# Patient Record
Sex: Female | Born: 1975 | Hispanic: Yes | Marital: Married | Smoking: Never smoker
Health system: Southern US, Community
[De-identification: ages and names within clinical notes are randomized; demographics above are authoritative.]

## PROBLEM LIST (undated history)

## (undated) DIAGNOSIS — R42 Dizziness and giddiness: Secondary | ICD-10-CM

## (undated) DIAGNOSIS — I10 Essential (primary) hypertension: Secondary | ICD-10-CM

## (undated) HISTORY — PX: ABDOMINAL HYSTERECTOMY: SHX81

---

## 2010-12-28 ENCOUNTER — Emergency Department: Payer: Self-pay | Admitting: Emergency Medicine

## 2019-09-23 ENCOUNTER — Emergency Department
Admission: EM | Admit: 2019-09-23 | Discharge: 2019-09-23 | Disposition: A | Discharge status: Home / Self Care | Payer: BC Managed Care – PPO | Attending: Emergency Medicine | Admitting: Emergency Medicine

## 2019-09-23 ENCOUNTER — Other Ambulatory Visit: Payer: Self-pay

## 2019-09-23 ENCOUNTER — Encounter: Payer: Self-pay | Admitting: Emergency Medicine

## 2019-09-23 DIAGNOSIS — Z20822 Contact with and (suspected) exposure to covid-19: Secondary | ICD-10-CM

## 2019-09-23 DIAGNOSIS — M791 Myalgia, unspecified site: Secondary | ICD-10-CM | POA: Insufficient documentation

## 2019-09-23 DIAGNOSIS — R0602 Shortness of breath: Secondary | ICD-10-CM | POA: Diagnosis present

## 2019-09-23 DIAGNOSIS — R509 Fever, unspecified: Secondary | ICD-10-CM | POA: Insufficient documentation

## 2019-09-23 MED ORDER — ONDANSETRON HCL 4 MG PO TABS: 4.0000 mg | ORAL_TABLET | Freq: Three times a day (TID) | ORAL | 0 refills | Status: AC | PRN

## 2019-09-23 MED ORDER — ALBUTEROL SULFATE HFA 108 (90 BASE) MCG/ACT IN AERS: 2.0000 | INHALATION_SPRAY | Freq: Four times a day (QID) | RESPIRATORY_TRACT | 0 refills | Status: AC | PRN

## 2019-09-23 NOTE — ED Notes (Signed)
Dr. Derrill Kay talked with Patient using interpreter, Patient states that she has been sick since Monday, and she had covid test along with her husband due to He has had same kind of symptoms, she complained of nausea, back pain, shortness of breath and body aches, she denies Si/hi or avh, states she has been nervous about her physical condition and about her Job, she wants a note for work to show she has been sick. Nurse will continue to monitor.

## 2019-09-23 NOTE — ED Triage Notes (Signed)
Pt presents to ED via [facility] from home c/o generalized body aches, nausea, and back pain since Monday. Pt was tested for COVID on Monday with husband who has same symptoms. Reports intermittent SOB and intermittent fevers.

## 2019-09-23 NOTE — ED Notes (Signed)
No peripheral IV placed this visit.   Discharge instructions reviewed with patient. Questions fielded by this RN. Patient verbalizes understanding of instructions. Patient discharged home in stable condition per goodman. No acute distress noted at time of discharge.   STRATUS used to discuss POC, follow up and prescriptions.  COVID contact precautions discussed  Pt wheeled to lobby for family ride

## 2019-09-23 NOTE — ED Provider Notes (Signed)
[facility] Emergency Department Provider Note  ____________________________________________   I have reviewed the triage vital signs and the nursing notes.   HISTORY  Chief Complaint Shortness of breath  History limited by: Language [facility] Interpreter utilized   HPI Jenna Raymond is a 44 y.o. female who presents to the emergency department today because of concern for shortness of breath that started today. Patient states that for roughly the past week she has been dealing with fevers, muscle aches and nausea. Her husband has had similar symptoms. She was tested for covid earlier this week but has not yet gotten the result. The patient says that the shortness of breath started today. She denies history of lung or heart disease.   Records reviewed.   No past medical history on file.  There are no problems to display for this patient.   Prior to Admission medications   Medication Sig Start Date End Date Taking? Authorizing Provider  albuterol (VENTOLIN HFA) 108 (90 Base) MCG/ACT inhaler Inhale 2 puffs into the lungs every 6 (six) hours as needed for wheezing or shortness of breath. Suspected COVID 09/23/19   Phineas Semen, MD  ondansetron (ZOFRAN) 4 MG tablet Take 1 tablet (4 mg total) by mouth every 8 (eight) hours as needed. 09/23/19   Phineas Semen, MD    Allergies Patient has no allergy information on record.  No family history on file.  Social History Social History   Tobacco Use  . Smoking status: Not on file  Substance Use Topics  . Alcohol use: Not on file  . Drug use: Not on file    Review of Systems Constitutional: Positive for fever. Eyes: No visual changes. ENT: Positive for change in taste.  Cardiovascular: Denies chest pain. Respiratory: Positive for shortness of breath. Gastrointestinal: No abdominal pain.  Positive for nausea.  Genitourinary: Negative for dysuria. Musculoskeletal: Positive for  back pain and muscle aches.  Skin: Negative for rash. Neurological: Positive for headache.  ____________________________________________   PHYSICAL EXAM:  VITAL SIGNS: ED Triage Vitals  Enc Vitals Group     BP 186/100     Pulse 92     Resp 18     Temp 98.7     Temp src      SpO2 94   Constitutional: Alert and oriented.  Eyes: Conjunctivae are normal.  ENT      Head: Normocephalic and atraumatic.      Nose: No congestion/rhinnorhea.      Mouth/Throat: Mucous membranes are moist.      Neck: No stridor. Hematological/Lymphatic/Immunilogical: No cervical lymphadenopathy. Cardiovascular: Normal rate, regular rhythm.  No murmurs, rubs, or gallops.  Respiratory: Normal respiratory effort without tachypnea nor retractions. Breath sounds are clear and equal bilaterally. No wheezes/rales/rhonchi. Gastrointestinal: Soft and non tender. No rebound. No guarding.  Genitourinary: Deferred Musculoskeletal: Normal range of motion in all extremities. No lower extremity edema. Neurologic:  Normal speech and language. No gross focal neurologic deficits are appreciated.  Skin:  Skin is warm, dry and intact. No rash noted. Psychiatric: Mood and affect are normal. Speech and behavior are normal. Patient exhibits appropriate insight and judgment.  ____________________________________________    LABS (pertinent positives/negatives)  None  ____________________________________________   EKG  None  ____________________________________________    RADIOLOGY  None  ____________________________________________   PROCEDURES  Procedures  ____________________________________________   INITIAL IMPRESSION / ASSESSMENT AND PLAN / ED COURSE  Pertinent labs & imaging results that were available during my care of  the patient were reviewed by me and considered in my medical decision making (see chart for details).   Patient presented to the emergency department today with signs and  symptoms consistent with covid. Has test pending. Came in today because of new symptom of shortness of breath. Discussed covid with patient via interpreter. Discussed obtaining a pulse ox to monitor her oxygen saturation at home. Will give prescription for antiemetic and albuterol.  ____________________________________________   FINAL CLINICAL IMPRESSION(S) / ED DIAGNOSES  Final diagnoses:  Suspected COVID-19 virus infection     Note: This dictation was prepared with Dragon dictation. Any transcriptional errors that result from this process are unintentional     Nance Pear, MD 09/23/19 1935

## 2019-09-26 ENCOUNTER — Encounter: Payer: Self-pay | Admitting: Medical Oncology

## 2019-09-26 ENCOUNTER — Other Ambulatory Visit: Payer: Self-pay

## 2019-09-26 ENCOUNTER — Emergency Department
Admission: EM | Admit: 2019-09-26 | Discharge: 2019-09-26 | Disposition: A | Discharge status: Home / Self Care | Payer: BC Managed Care – PPO | Attending: Emergency Medicine | Admitting: Emergency Medicine

## 2019-09-26 ENCOUNTER — Emergency Department: Payer: BC Managed Care – PPO

## 2019-09-26 DIAGNOSIS — B349 Viral infection, unspecified: Secondary | ICD-10-CM

## 2019-09-26 DIAGNOSIS — U071 COVID-19: Secondary | ICD-10-CM | POA: Insufficient documentation

## 2019-09-26 DIAGNOSIS — M7918 Myalgia, other site: Secondary | ICD-10-CM | POA: Diagnosis present

## 2019-09-26 LAB — CBC WITH DIFFERENTIAL/PLATELET
Abs Immature Granulocytes: 0.09 10*3/uL — ABNORMAL HIGH (ref 0.00–0.07)
Basophils Absolute: 0 10*3/uL (ref 0.0–0.1)
Basophils Relative: 0 %
Eosinophils Absolute: 0 10*3/uL (ref 0.0–0.5)
Eosinophils Relative: 0 %
HCT: 46.1 % — ABNORMAL HIGH (ref 36.0–46.0)
Hemoglobin: 14.7 g/dL (ref 12.0–15.0)
Immature Granulocytes: 1 %
Lymphocytes Relative: 14 %
Lymphs Abs: 1.7 10*3/uL (ref 0.7–4.0)
MCH: 26.1 pg (ref 26.0–34.0)
MCHC: 31.9 g/dL (ref 30.0–36.0)
MCV: 81.7 fL (ref 80.0–100.0)
Monocytes Absolute: 0.7 10*3/uL (ref 0.1–1.0)
Monocytes Relative: 6 %
Neutro Abs: 9.8 10*3/uL — ABNORMAL HIGH (ref 1.7–7.7)
Neutrophils Relative %: 79 %
Platelets: 353 10*3/uL (ref 150–400)
RBC: 5.64 MIL/uL — ABNORMAL HIGH (ref 3.87–5.11)
RDW: 13.6 % (ref 11.5–15.5)
WBC: 12.4 10*3/uL — ABNORMAL HIGH (ref 4.0–10.5)
nRBC: 0 % (ref 0.0–0.2)

## 2019-09-26 LAB — COMPREHENSIVE METABOLIC PANEL
ALT: 32 U/L (ref 0–44)
AST: 31 U/L (ref 15–41)
Albumin: 4.5 g/dL (ref 3.5–5.0)
Alkaline Phosphatase: 95 U/L (ref 38–126)
Anion gap: 14 (ref 5–15)
BUN: 13 mg/dL (ref 6–20)
CO2: 21 mmol/L — ABNORMAL LOW (ref 22–32)
Calcium: 9.6 mg/dL (ref 8.9–10.3)
Chloride: 104 mmol/L (ref 98–111)
Creatinine, Ser: 0.62 mg/dL (ref 0.44–1.00)
GFR calc Af Amer: >60 mL/min (ref >60)
GFR calc non Af Amer: >60 mL/min (ref >60)
Glucose, Bld: 106 mg/dL — ABNORMAL HIGH (ref 70–99)
Potassium: 4.2 mmol/L (ref 3.5–5.1)
Sodium: 139 mmol/L (ref 135–145)
Total Bilirubin: 0.7 mg/dL (ref 0.3–1.2)
Total Protein: 8.8 g/dL — ABNORMAL HIGH (ref 6.5–8.1)

## 2019-09-26 LAB — TROPONIN I (HIGH SENSITIVITY): Troponin I (High Sensitivity): <2 ng/L (ref <18)

## 2019-09-26 LAB — SARS CORONAVIRUS 2 (TAT 6-24 HRS): SARS Coronavirus 2: POSITIVE — AB

## 2019-09-26 NOTE — ED Notes (Signed)
See triage note  Presents with SOB,cough and body aches    Subjective fever  But afebrile on arrival   Awaiting COVID results

## 2019-09-26 NOTE — ED Triage Notes (Signed)
Pt reports that she has been having cough, body aches, fever, sob, fatigue x 1 week. Was tested for covid and did not receive results. Pts husband has same sx's.

## 2019-09-26 NOTE — ED Provider Notes (Signed)
[facility] Emergency Department Provider Note  ____________________________________________   First MD Initiated Contact with Patient 09/26/19 1303     (approximate)  I have reviewed the triage vital signs and the nursing notes.   HISTORY Spanish interpreter  Chief Complaint Generalized Body Aches, Fever, Cough, Fatigue, and Back Pain   HPI Jenna Raymond is a 44 y.o. female presents to the ED with complaint of shortness of breath, cough, body aches and subjective fever.  Patient states that she had a Covid test done through the health department approximately 7 days ago and is still not received the results of that test.  Patient also complains of fatigue.  She states that her husband has same symptoms at home as he has not received the results of his test as well.  Patient is a non-smoker.  She was seen on 09/23/2019 and an albuterol inhaler was given to her.  Patient continues to use the albuterol inhaler every 6 hours.  She denies any other symptoms.      History reviewed. No pertinent past medical history.  There are no problems to display for this patient.   History reviewed. No pertinent surgical history.  Prior to Admission medications   Medication Sig Start Date End Date Taking? Authorizing Provider  albuterol (VENTOLIN HFA) 108 (90 Base) MCG/ACT inhaler Inhale 2 puffs into the lungs every 6 (six) hours as needed for wheezing or shortness of breath. Suspected COVID 09/23/19   Phineas Semen, MD  ondansetron (ZOFRAN) 4 MG tablet Take 1 tablet (4 mg total) by mouth every 8 (eight) hours as needed. 09/23/19   Phineas Semen, MD    Allergies Patient has no known allergies.  No family history on file.  Social History Social History   Tobacco Use  . Smoking status: Never Smoker  . Smokeless tobacco: Never Used  Substance Use Topics  . Alcohol use: Never  . Drug use: Not on file    Review of Systems Constitutional:  Subjective fever/chills Eyes: No visual changes. ENT: No sore throat. Cardiovascular: Positive for anterior chest wall pain. Respiratory: Positive shortness of breath.  Positive for cough. Gastrointestinal: No abdominal pain.  No nausea, no vomiting.  No diarrhea.  No constipation. Genitourinary: Negative for dysuria. Musculoskeletal: Positive for muscle aches. Skin: Negative for rash. Neurological: Negative for headaches, focal weakness or numbness. ____________________________________________   PHYSICAL EXAM:  VITAL SIGNS: ED Triage Vitals [09/26/19 1057]  Enc Vitals Group     BP (!) 155/101     Pulse Rate (!) 110     Resp 18     Temp 98.6 F (37 C)     Temp Source Oral     SpO2 97 %     Weight 158 lb 11.7 oz (72 kg)     Height 5\' 3"  (1.6 m)     Head Circumference      Peak Flow      Pain Score 9     Pain Loc      Pain Edu?      Excl. in GC?     Constitutional: Alert and oriented. Well appearing and in no acute distress. Eyes: Conjunctivae are normal.  Head: Atraumatic. Nose: No congestion/rhinnorhea. Neck: No stridor.   Cardiovascular: Normal rate, regular rhythm. Grossly normal heart sounds.  Good peripheral circulation. Respiratory: Normal respiratory effort.  No retractions. Lungs no wheezes, rales or rhonchi noted.  Patient is able to talk in complete sentences without any difficulty. Gastrointestinal: Soft  and nontender. No distention.  No CVA tenderness. Musculoskeletal: Moves upper and lower extremities any difficulty normal gait was noted. Neurologic:  Normal speech and language. No gross focal neurologic deficits are appreciated. No gait instability. Skin:  Skin is warm, dry and intact. No rash noted. Psychiatric: Mood and affect are normal. Speech and behavior are normal.  ____________________________________________   LABS (all labs ordered are listed, but only abnormal results are displayed)  Labs Reviewed  CBC WITH DIFFERENTIAL/PLATELET -  Abnormal; Notable for the following components:      Result Value   WBC 12.4 (*)    RBC 5.64 (*)    HCT 46.1 (*)    Neutro Abs 9.8 (*)    Abs Immature Granulocytes 0.09 (*)    All other components within normal limits  COMPREHENSIVE METABOLIC PANEL - Abnormal; Notable for the following components:   CO2 21 (*)    Glucose, Bld 106 (*)    Total Protein 8.8 (*)    All other components within normal limits  SARS CORONAVIRUS 2 (TAT 6-24 HRS)  TROPONIN I (HIGH SENSITIVITY)  TROPONIN I (HIGH SENSITIVITY)   ____________________________________________  EKG  Reviewed by MD. ____________________________________________  RADIOLOGY  Official radiology report(s): DG Chest Portable 1 View  Result Date: 09/26/2019 CLINICAL DATA:  Pain short of breath EXAM: PORTABLE CHEST 1 VIEW COMPARISON:  None FINDINGS: Some fullness in left suprahilar region. Cardiomediastinal contours are otherwise unremarkable. Lungs are clear. No signs of pleural effusion. Visualized skeletal structures are unremarkable. IMPRESSION: 1. Some fullness in the left suprahilar region/AP window. Unclear as to whether this is artifact/projectional. Consider follow-up PA and lateral chest x-ray when the patient is able for further assessment. 2. No signs of consolidation or pleural effusion. Electronically Signed   By: Zetta Bills M.D.   On: 09/26/2019 14:19    ____________________________________________   PROCEDURES  Procedure(s) performed (including Critical Care):  Procedures   ____________________________________________   INITIAL IMPRESSION / ASSESSMENT AND PLAN / ED COURSE  As part of my medical decision making, I reviewed the following data within the [facility], Notes from prior ED visits and [place] Controlled Substance Database. Jenna Raymond was evaluated in Emergency Department on 09/26/2019 for the symptoms described in the history of present illness. She was evaluated in the  context of the global COVID-19 pandemic, which necessitated consideration that the patient might be at risk for infection with the SARS-CoV-2 virus that causes COVID-19. Institutional protocols and algorithms that pertain to the evaluation of patients at risk for COVID-19 are in a state of rapid change based on information released by regulatory bodies including the CDC and federal and state organizations. These policies and algorithms were followed during the patient's care in the ED.  44 year old female presents to the ED with complaint of cough, congestion, body aches, subjective fever, shortness of breath and fatigue for 1 week.  Patient states that she and her husband both have the same symptoms.  She went through the health department at one of the clinics to get a Covid test and has not received the results in 7 days.  Chest x-ray was reassuring and lab work showed a white count slightly elevated suggestive of a viral illness.  Patient was made aware that a Covid test was done in the ED today and that she should receive the results of her test tomorrow morning.  Patient was given a note for work stating that she is to quarantine for the next 2  days and if her test is positive she will quarantine for an additional 10 days.  Patient was ambulatory without assistance at the time of her discharge and understands the instructions that the interpreter has given her.  ____________________________________________   FINAL CLINICAL IMPRESSION(S) / ED DIAGNOSES  Final diagnoses:  Viral illness     ED Discharge Orders    None       Note:  This document was prepared using Dragon voice recognition software and may include unintentional dictation errors.    Tommi Rumps, PA-C 09/26/19 1653    Dionne Bucy, MD 10/02/19 [id number]

## 2019-12-17 ENCOUNTER — Ambulatory Visit: Payer: Self-pay

## 2019-12-17 ENCOUNTER — Ambulatory Visit: Payer: BC Managed Care – PPO | Attending: Internal Medicine

## 2019-12-17 DIAGNOSIS — Z23 Encounter for immunization: Secondary | ICD-10-CM

## 2019-12-17 NOTE — Progress Notes (Signed)
   Covid-19 Vaccination Clinic  Name:  Nataleigh Griffin    MRN: [id number] DOB: [birth year]  12/17/2019  Ms. Cuchillas de Trisha Mangle was observed post Covid-19 immunization for 15 minutes without incident. She was provided with Vaccine Information Sheet and instruction to access the V-Safe system.   Ms. Jamas Lav de Trisha Mangle was instructed to call 911 with any severe reactions post vaccine: Marland Kitchen Difficulty breathing  . Swelling of face and throat  . A fast heartbeat  . A bad rash all over body  . Dizziness and weakness   Immunizations Administered    Name Date Dose VIS Date Route   Pfizer COVID-19 Vaccine 12/17/2019 11:49 AM 0.3 mL 09/02/2019 Intramuscular   Manufacturer: [employer], [employer]   Lot: [id number]   NDC: [address]-3

## 2019-12-30 ENCOUNTER — Encounter: Payer: Self-pay | Admitting: *Deleted

## 2019-12-30 ENCOUNTER — Other Ambulatory Visit: Payer: Self-pay

## 2019-12-30 DIAGNOSIS — Y9389 Activity, other specified: Secondary | ICD-10-CM | POA: Diagnosis not present

## 2019-12-30 DIAGNOSIS — S8012XA Contusion of left lower leg, initial encounter: Secondary | ICD-10-CM | POA: Diagnosis not present

## 2019-12-30 DIAGNOSIS — W228XXA Striking against or struck by other objects, initial encounter: Secondary | ICD-10-CM | POA: Diagnosis not present

## 2019-12-30 DIAGNOSIS — Y990 Civilian activity done for income or pay: Secondary | ICD-10-CM | POA: Diagnosis not present

## 2019-12-30 DIAGNOSIS — I10 Essential (primary) hypertension: Secondary | ICD-10-CM | POA: Diagnosis not present

## 2019-12-30 DIAGNOSIS — Y929 Unspecified place or not applicable: Secondary | ICD-10-CM | POA: Diagnosis not present

## 2019-12-30 DIAGNOSIS — S8992XA Unspecified injury of left lower leg, initial encounter: Secondary | ICD-10-CM | POA: Diagnosis present

## 2019-12-30 NOTE — ED Triage Notes (Signed)
Per video interpreter, patient is an employee at [employer] and had a cream container exploded and hit left upper thigh. Patient has pants on, but states left upper thigh is purple and swollen. Patient is able to ambulate. Patient's manager brought the patient to the ED.

## 2019-12-31 ENCOUNTER — Emergency Department: Payer: Worker's Compensation

## 2019-12-31 ENCOUNTER — Emergency Department
Admission: EM | Admit: 2019-12-31 | Discharge: 2019-12-31 | Disposition: A | Discharge status: Home / Self Care | Payer: Worker's Compensation | Attending: Emergency Medicine | Admitting: Emergency Medicine

## 2019-12-31 DIAGNOSIS — T1490XA Injury, unspecified, initial encounter: Secondary | ICD-10-CM

## 2019-12-31 DIAGNOSIS — S8012XA Contusion of left lower leg, initial encounter: Secondary | ICD-10-CM

## 2019-12-31 HISTORY — DX: Essential (primary) hypertension: I10

## 2019-12-31 MED ORDER — IBUPROFEN 400 MG PO TABS
600.0000 mg | ORAL_TABLET | Freq: Once | ORAL | Status: AC
  Administered 2019-12-31: 600 mg via ORAL
  Filled 2019-12-31: qty 2

## 2019-12-31 NOTE — ED Notes (Signed)
Pt reports pressurized aluminum can of cream exploded on left quad   brusing present and pt able to walk and bear wieght on that leg but reports pain   EDP conducting education on the benefits of antiinflammatory meds and ice therapy

## 2019-12-31 NOTE — ED Provider Notes (Signed)
[facility] Emergency Department Provider Note  ____________________________________________  Time seen: Approximately 2:23 AM  I have reviewed the triage vital signs and the nursing notes.   HISTORY  Chief Complaint Leg Pain   HPI Jenna Raymond is a 44 y.o. female who presents for evaluation of left thigh pain.  Patient works at [employer].  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  She reports that one of the aluminum containers exploded and hit her in her thigh.  The contents of the can and cream were not hot.  She is complaining of moderate constant sharp pain that is worse with ambulation.  She did not fall, did not hit her head, did not pass out.  There is no lacerations.  Her manager brought her here for evaluation.  She is able to ambulate.   Past Medical History:  Diagnosis Date  . Hypertension     Past Surgical History:  Procedure Laterality Date  . ABDOMINAL HYSTERECTOMY      Prior to Admission medications   Medication Sig Start Date End Date Taking? Authorizing Provider  albuterol (VENTOLIN HFA) 108 (90 Base) MCG/ACT inhaler Inhale 2 puffs into the lungs every 6 (six) hours as needed for wheezing or shortness of breath. Suspected COVID 09/23/19   Phineas Semen, MD  ondansetron (ZOFRAN) 4 MG tablet Take 1 tablet (4 mg total) by mouth every 8 (eight) hours as needed. 09/23/19   Phineas Semen, MD    Allergies Patient has no known allergies.  No family history on file.  Social History Social History   Tobacco Use  . Smoking status: Never Smoker  . Smokeless tobacco: Never Used  Substance Use Topics  . Alcohol use: Never  . Drug use: Not on file    Review of Systems  Constitutional: Negative for fever. Eyes: Negative for visual changes. ENT: Negative for sore throat. Neck: No neck pain  Cardiovascular: Negative for chest pain. Respiratory: Negative for shortness of  breath. Gastrointestinal: Negative for abdominal pain, vomiting or diarrhea. Genitourinary: Negative for dysuria. Musculoskeletal: Negative for back pain. + L leg pain Skin: Negative for rash. Neurological: Negative for headaches, weakness or numbness. Psych: No SI or HI  ____________________________________________   PHYSICAL EXAM:  VITAL SIGNS: ED Triage Vitals  Enc Vitals Group     BP 12/30/19 2217 (!) 167/95     Pulse Rate 12/30/19 2217 (!) 102     Resp 12/30/19 2217 20     Temp 12/30/19 2217 97.8 F (36.6 C)     Temp Source 12/30/19 2217 Oral     SpO2 12/30/19 2217 96 %     Weight 12/30/19 2232 170 lb (77.1 kg)     Height 12/30/19 2232 5\' 2"  (1.575 m)     Head Circumference --      Peak Flow --      Pain Score 12/30/19 2230 9     Pain Loc --      Pain Edu? --      Excl. in GC? --     Constitutional: Alert and oriented. Well appearing and in no apparent distress. HEENT:      Head: Normocephalic and atraumatic.         Eyes: Conjunctivae are normal. Sclera is non-icteric.       Mouth/Throat: Mucous membranes are moist.       Neck: Supple with no signs of meningismus. Cardiovascular: Regular rate and rhythm.  Respiratory: Normal respiratory  effort. Lungs are clear to auscultation bilaterally.  Gastrointestinal: Soft, non tender Musculoskeletal: bruising to the L thigh with no obvious deformities or lacerations. Strong distal pulses, normal gait Neurologic: Normal speech and language. Face is symmetric. Moving all extremities. No gross focal neurologic deficits are appreciated. Skin: Skin is warm, dry and intact. No rash noted. Psychiatric: Mood and affect are normal. Speech and behavior are normal.  ____________________________________________   LABS (all labs ordered are listed, but only abnormal results are displayed)  Labs Reviewed - No data to display ____________________________________________  EKG  none   ____________________________________________  RADIOLOGY  I have personally reviewed the images performed during this visit and I agree with the Radiologist's read.   Interpretation by Radiologist:  DG FEMUR MIN 2 VIEWS LEFT  Result Date: 12/31/2019 CLINICAL DATA:  Cream container exploded and hit left upper thigh EXAM: LEFT FEMUR 2 VIEWS COMPARISON:  None. FINDINGS: There is no evidence of fracture or other focal bone lesions. Soft tissues are unremarkable. IMPRESSION: Negative. Electronically Signed   By: Donavan Foil M.D.   On: 12/31/2019 02:08     ____________________________________________   PROCEDURES  Procedure(s) performed: None Procedures Critical Care performed:  None ____________________________________________   INITIAL IMPRESSION / ASSESSMENT AND PLAN / ED COURSE  44 y.o. female who presents for evaluation of left thigh pain after sustaining an injury at work.  No fracture, no laceration, neurovascular intact exam.  X-ray negative, reviewed by me.  Patient was given ibuprofen for pain.  Recommended ice.  Discussed my standard return precautions       _____________________________________________ Please note:  Patient was evaluated in Emergency Department today for the symptoms described in the history of present illness. Patient was evaluated in the context of the global COVID-19 pandemic, which necessitated consideration that the patient might be at risk for infection with the SARS-CoV-2 virus that causes COVID-19. Institutional protocols and algorithms that pertain to the evaluation of patients at risk for COVID-19 are in a state of rapid change based on information released by regulatory bodies including the CDC and federal and state organizations. These policies and algorithms were followed during the patient's care in the ED.  Some ED evaluations and interventions may be delayed as a result of limited staffing during the pandemic.   [place] Controlled Substance  Database was reviewed by me. ____________________________________________   FINAL CLINICAL IMPRESSION(S) / ED DIAGNOSES   Final diagnoses:  Trauma  Contusion of left lower extremity, initial encounter      NEW MEDICATIONS STARTED DURING THIS VISIT:  ED Discharge Orders    None       Note:  This document was prepared using Dragon voice recognition software and may include unintentional dictation errors.    Alfred Levins, [place], MD 12/31/19 Reece Agar

## 2019-12-31 NOTE — ED Notes (Addendum)
No peripheral IV placed this visit.   Discharge instructions reviewed with patient. Questions fielded by this RN. Patient verbalizes understanding of instructions. Patient discharged home in stable condition per veronese. No acute distress noted at time of discharge.   Delay to DC d/t worker's comp paperwork with registration

## 2020-01-07 ENCOUNTER — Ambulatory Visit: Payer: BC Managed Care – PPO | Attending: Internal Medicine

## 2020-01-07 ENCOUNTER — Ambulatory Visit: Payer: Self-pay

## 2020-01-07 DIAGNOSIS — Z23 Encounter for immunization: Secondary | ICD-10-CM

## 2020-01-07 NOTE — Progress Notes (Signed)
   Covid-19 Vaccination Clinic  Name:  Francheska Villeda    MRN: [id number] DOB: 02-Jun-1976  01/07/2020  Ms. Cuchillas de Trisha Mangle was observed post Covid-19 immunization for 15 minutes without incident. She was provided with Vaccine Information Sheet and instruction to access the V-Safe system.   Ms. Jamas Lav de Trisha Mangle was instructed to call 911 with any severe reactions post vaccine: Marland Kitchen Difficulty breathing  . Swelling of face and throat  . A fast heartbeat  . A bad rash all over body  . Dizziness and weakness   Immunizations Administered    Name Date Dose VIS Date Route   Pfizer COVID-19 Vaccine 01/07/2020 11:29 AM 0.3 mL 09/02/2019 Intramuscular   Manufacturer: [employer], [employer]   Lot: [id number]   NDC: [address]-3

## 2020-09-27 ENCOUNTER — Encounter: Payer: Self-pay | Admitting: Emergency Medicine

## 2020-09-27 ENCOUNTER — Other Ambulatory Visit: Payer: Self-pay

## 2020-09-27 ENCOUNTER — Emergency Department: Payer: Worker's Compensation

## 2020-09-27 DIAGNOSIS — X501XXA Overexertion from prolonged static or awkward postures, initial encounter: Secondary | ICD-10-CM | POA: Diagnosis not present

## 2020-09-27 DIAGNOSIS — I10 Essential (primary) hypertension: Secondary | ICD-10-CM | POA: Diagnosis not present

## 2020-09-27 DIAGNOSIS — Y990 Civilian activity done for income or pay: Secondary | ICD-10-CM | POA: Insufficient documentation

## 2020-09-27 DIAGNOSIS — S86911A Strain of unspecified muscle(s) and tendon(s) at lower leg level, right leg, initial encounter: Secondary | ICD-10-CM | POA: Insufficient documentation

## 2020-09-27 DIAGNOSIS — M25561 Pain in right knee: Secondary | ICD-10-CM | POA: Insufficient documentation

## 2020-09-27 DIAGNOSIS — S99911A Unspecified injury of right ankle, initial encounter: Secondary | ICD-10-CM | POA: Diagnosis present

## 2020-09-27 NOTE — ED Triage Notes (Signed)
Pt to ED from work c/o right ankle injury today and then pain to right calf, denies falling.  Pt filing WC, works at [employer], work profile requires UDS.  Pt A&Ox4, chest rise even and unlabored, in NAD at this time.

## 2020-09-28 ENCOUNTER — Emergency Department: Payer: Worker's Compensation

## 2020-09-28 ENCOUNTER — Emergency Department
Admission: EM | Admit: 2020-09-28 | Discharge: 2020-09-28 | Disposition: A | Discharge status: Home / Self Care | Payer: Worker's Compensation | Attending: Emergency Medicine | Admitting: Emergency Medicine

## 2020-09-28 DIAGNOSIS — S86811A Strain of other muscle(s) and tendon(s) at lower leg level, right leg, initial encounter: Secondary | ICD-10-CM

## 2020-09-28 NOTE — ED Provider Notes (Signed)
[facility] Emergency Department Provider Note   ____________________________________________   None    (approximate)  I have reviewed the triage vital signs and the nursing notes.   HISTORY  Chief Complaint Ankle Injury    HPI Jenna Raymond is a 45 y.o. female with past medical history of hypertension who presents to the ED complaining of leg pain.  Patient reports that she was going up stairs at work when she twisted her right leg and had immediate onset of pain just below her right knee.  Pain is described as sharp and exacerbated by movement.  She has been able to range her knee since then but has significant discomfort doing so.  She denies any pain in her foot or ankle.  She has no history of prior injuries to her right knee.  She has not taken anything for symptoms prior to arrival.        Past Medical History:  Diagnosis Date  . Hypertension     There are no problems to display for this patient.   Past Surgical History:  Procedure Laterality Date  . CESAREAN SECTION      Prior to Admission medications   Medication Sig Start Date End Date Taking? Authorizing Provider  albuterol (VENTOLIN HFA) 108 (90 Base) MCG/ACT inhaler Inhale 2 puffs into the lungs every 6 (six) hours as needed for wheezing or shortness of breath. Suspected COVID 09/23/19   Phineas Semen, MD  ondansetron (ZOFRAN) 4 MG tablet Take 1 tablet (4 mg total) by mouth every 8 (eight) hours as needed. 09/23/19   Phineas Semen, MD    Allergies Patient has no known allergies.  History reviewed. No pertinent family history.  Social History Social History   Tobacco Use  . Smoking status: Never Smoker  . Smokeless tobacco: Never Used  Substance Use Topics  . Alcohol use: Never  . Drug use: Never    Review of Systems  Constitutional: No fever/chills Eyes: No visual changes. ENT: No sore throat. Cardiovascular: Denies chest pain. Respiratory:  Denies shortness of breath. Gastrointestinal: No abdominal pain.  No nausea, no vomiting.  No diarrhea.  No constipation. Genitourinary: Negative for dysuria. Musculoskeletal: Negative for back pain.  Positive for right knee pain. Skin: Negative for rash. Neurological: Negative for headaches, focal weakness or numbness.  ____________________________________________   PHYSICAL EXAM:  VITAL SIGNS: ED Triage Vitals  Enc Vitals Group     BP 09/27/20 2140 (!) 149/75     Pulse Rate 09/27/20 2140 (!) 105     Resp 09/27/20 2140 16     Temp 09/27/20 2140 97.6 F (36.4 C)     Temp Source 09/27/20 2140 Oral     SpO2 09/27/20 2140 100 %     Weight 09/27/20 2141 160 lb (72.6 kg)     Height 09/27/20 2141 5\' 2"  (1.575 m)     Head Circumference --      Peak Flow --      Pain Score 09/27/20 2141 10     Pain Loc --      Pain Edu? --      Excl. in GC? --     Constitutional: Alert and oriented. Eyes: Conjunctivae are normal. Head: Atraumatic. Nose: No congestion/rhinnorhea. Mouth/Throat: Mucous membranes are moist. Neck: Normal ROM Cardiovascular: Normal rate, regular rhythm. Grossly normal heart sounds.  2+ DP pulses bilaterally. Respiratory: Normal respiratory effort.  No retractions. Lungs CTAB. Gastrointestinal: Soft and nontender. No distention. Genitourinary: deferred Musculoskeletal:  No lower extremity tenderness nor edema.  Tenderness to right posterior knee and upper calf with no obvious deformity.  No left knee tenderness or bilateral ankle tenderness. Neurologic:  Normal speech and language. No gross focal neurologic deficits are appreciated. Skin:  Skin is warm, dry and intact. No rash noted. Psychiatric: Mood and affect are normal. Speech and behavior are normal.  ____________________________________________   LABS (all labs ordered are listed, but only abnormal results are displayed)  Labs Reviewed - No data to display   PROCEDURES  Procedure(s) performed (including  Critical Care):  Procedures   ____________________________________________   INITIAL IMPRESSION / ASSESSMENT AND PLAN / ED COURSE       45 year old female with past medical history of hypertension who presents to the ED complaining of pain behind her right knee and in the area of her upper calf after twisting her leg at work.  She is neurovascularly intact to her bilateral lower extremities.  X-ray of right ankle reviewed by me and shows no obvious fracture or dislocation.  We will further assess with x-ray of right knee but I suspect calf muscle strain as the source of her pain.  Right knee x-ray reviewed by me and shows no fracture or dislocation.  Patient is appropriate for discharge home with conservative treatment with NSAIDs, rest, and ice.  Patient counseled to follow-up with her PCP and otherwise return to the ED for new worsening symptoms, patient agrees with plan.      ____________________________________________   FINAL CLINICAL IMPRESSION(S) / ED DIAGNOSES  Final diagnoses:  Strain of calf muscle, right, initial encounter     ED Discharge Orders    None       Note:  This document was prepared using Dragon voice recognition software and may include unintentional dictation errors.   Chesley Noon, MD 09/28/20 [phone]

## 2021-02-27 ENCOUNTER — Other Ambulatory Visit: Payer: Self-pay | Admitting: Obstetrics and Gynecology

## 2021-02-27 DIAGNOSIS — Z1231 Encounter for screening mammogram for malignant neoplasm of breast: Secondary | ICD-10-CM

## 2021-04-16 ENCOUNTER — Ambulatory Visit
Admission: RE | Admit: 2021-04-16 | Discharge: 2021-04-16 | Disposition: A | Discharge status: Home / Self Care | Payer: BC Managed Care – PPO | Source: Ambulatory Visit | Attending: Obstetrics and Gynecology | Admitting: Obstetrics and Gynecology

## 2021-04-16 ENCOUNTER — Other Ambulatory Visit: Payer: Self-pay

## 2021-04-16 DIAGNOSIS — Z1231 Encounter for screening mammogram for malignant neoplasm of breast: Secondary | ICD-10-CM | POA: Diagnosis present

## 2021-04-29 ENCOUNTER — Other Ambulatory Visit: Payer: Self-pay | Admitting: Obstetrics and Gynecology

## 2021-04-29 DIAGNOSIS — R928 Other abnormal and inconclusive findings on diagnostic imaging of breast: Secondary | ICD-10-CM

## 2021-04-29 DIAGNOSIS — R921 Mammographic calcification found on diagnostic imaging of breast: Secondary | ICD-10-CM

## 2021-05-01 ENCOUNTER — Ambulatory Visit (INDEPENDENT_AMBULATORY_CARE_PROVIDER_SITE_OTHER): Payer: BC Managed Care – PPO | Admitting: Surgery

## 2021-05-01 ENCOUNTER — Encounter: Payer: Self-pay | Admitting: Surgery

## 2021-05-01 ENCOUNTER — Other Ambulatory Visit: Payer: Self-pay

## 2021-05-01 DIAGNOSIS — G8929 Other chronic pain: Secondary | ICD-10-CM

## 2021-05-01 DIAGNOSIS — M25561 Pain in right knee: Secondary | ICD-10-CM

## 2021-05-01 DIAGNOSIS — Z87898 Personal history of other specified conditions: Secondary | ICD-10-CM | POA: Diagnosis not present

## 2021-05-01 NOTE — Progress Notes (Signed)
Office Visit Note   Patient: Jenna Raymond           Date of Birth: 03-05-1976           MRN: [id number] Visit Date: 05/01/2021              Requested by: [address] [place],  [place] [address] PCP: [facility]   Assessment & Plan: Visit Diagnoses:  1. Mechanical pain of right knee   2. Chronic pain of right knee   3. History of elevated antinuclear antibody (ANA)     Plan: With patient's documented history of elevated ANA without any further work-up or referral I recommended getting a CBC and arthritis panel today and this was drawn.  In regards to her ongoing right knee pain and some feeling mechanical symptoms I offered conservative treatment with injection.  Patient consent right knee was prepped with Betadine and intra-articular Marcaine/Depo-Medrol 6 to 1 injection performed.  Tolerated without complication.  Follow-up with me in 3 weeks for recheck of her right knee and to review labs.  If patient's knee continues to be symptomatic with some feeling mechanical symptoms I will plan to get an MRI to rule out meniscal tear.  All questions answered.  Follow-Up Instructions: Return in about 3 weeks (around 05/22/2021) for with Ranald Alessio recheck right knee and review labs.   Orders:  Orders Placed This Encounter  Procedures   Large Joint Inj   CBC   Antinuclear Antib (ANA)   Uric acid   Rheumatoid Factor   Sed Rate (ESR)    No orders of the defined types were placed in this encounter.     Procedures: Large Joint Inj: R knee on 05/01/2021 12:09 PM Indications: pain Details: 25 G 1.5 in needle, anteromedial approach Medications: 6 mL bupivacaine 0.25 %; 40 mg methylPREDNISolone acetate 40 MG/ML; 3 mL lidocaine 1 % Outcome: tolerated well, no immediate complications Consent was given by the patient. Patient was prepped and draped in the usual sterile fashion.      Clinical Data: No additional  findings.   Subjective: Chief Complaint  Patient presents with   Right Knee - Pain    HPI Patient comes in with the help of interpreter for evaluation of right knee pain.  Patient states that she had off-and-on knee problems for about 6 Months.  She was seen at the [facility] ED January 6022 after suffered a twisting injury of the right knee.  Patient had x-rays showed:  EXAM: RIGHT KNEE - 1-2 VIEW   COMPARISON:  None.   FINDINGS: Two view radiograph right knee demonstrates normal alignment. No fracture or dislocation. Medial and lateral compartment joint spaces are preserved. No effusion. Mild degenerative enthesopathy involving the insertion of the quadriceps tendon upon the patella. Soft tissues are unremarkable.   IMPRESSION: No acute fracture or dislocation.     Electronically Signed   By: Fidela Salisbury MD   On: 09/28/2020 01:10  Pain somewhat eased off but then she began having more problems with this was evaluated by her primary care provider last month.  She began to complain of pain and popping more along the medial joint line.  No complaints of true instability.  Pain worse with ambulation, pivoting movements.  I reviewed patient's notes from her clinical clinic primary care provider and she has a documented history of being an ANA positive.  Patient states that she was told this some years ago.  She  has never been referred to a rheumatologist and not much has been mentioned of it since she was previously advised.    Review of Systems No current cardiopulmonary GI GU issues  Objective: Vital Signs: There were no vitals taken for this visit.  Physical Exam Constitutional:      Appearance: Normal appearance.  HENT:     Head: Normocephalic and atraumatic.  Eyes:     Extraocular Movements: Extraocular movements intact.  Pulmonary:     Effort: No respiratory distress.  Musculoskeletal:     Comments: Gait is minimally antalgic.  Negative logroll bilateral hips.   Negative straight leg raise.  Right knee good range of motion.  No swelling or palpable effusion.  Some medial lateral joint line tenderness.  Mild discomfort with McMurray's testing.  Cruciate and collateral ligaments are stable.  Negative patellar apprehension.  Calf nontender.  Neurovascular intact.  Neurological:     General: No focal deficit present.     Mental Status: She is alert and oriented to person, place, and time.  Psychiatric:        Mood and Affect: Mood normal.    Ortho Exam  Specialty Comments:  No specialty comments available.  Imaging: No results found.   PMFS History: There are no problems to display for this patient.  Past Medical History:  Diagnosis Date   Hypertension     Family History  Problem Relation Age of Onset   Breast cancer Paternal Aunt     Past Surgical History:  Procedure Laterality Date   CESAREAN SECTION     Social History   Occupational History   Not on file  Tobacco Use   Smoking status: Never   Smokeless tobacco: Never  Substance and Sexual Activity   Alcohol use: Never   Drug use: Never   Sexual activity: Not on file

## 2021-05-03 LAB — CBC
HCT: 41.6 % (ref 35.0–45.0)
Hemoglobin: 13.8 g/dL (ref 11.7–15.5)
MCH: 27.8 pg (ref 27.0–33.0)
MCHC: 33.2 g/dL (ref 32.0–36.0)
MCV: 83.9 fL (ref 80.0–100.0)
MPV: 11.4 fL (ref 7.5–12.5)
Platelets: 349 10*3/uL (ref 140–400)
RBC: 4.96 10*6/uL (ref 3.80–5.10)
RDW: 13 % (ref 11.0–15.0)
WBC: 9.4 10*3/uL (ref 3.8–10.8)

## 2021-05-03 LAB — SEDIMENTATION RATE: Sed Rate: 43 mm/h — ABNORMAL HIGH (ref 0–20)

## 2021-05-03 LAB — RHEUMATOID FACTOR: Rheumatoid fact SerPl-aCnc: <14 IU/mL (ref <14)

## 2021-05-03 LAB — URIC ACID: Uric Acid, Serum: 6.8 mg/dL (ref 2.5–7.0)

## 2021-05-03 LAB — ANA: Anti Nuclear Antibody (ANA): NEGATIVE

## 2021-05-06 ENCOUNTER — Other Ambulatory Visit: Payer: Self-pay

## 2021-05-06 ENCOUNTER — Ambulatory Visit
Admission: RE | Admit: 2021-05-06 | Discharge: 2021-05-06 | Disposition: A | Discharge status: Home / Self Care | Payer: BC Managed Care – PPO | Source: Ambulatory Visit | Attending: Obstetrics and Gynecology | Admitting: Obstetrics and Gynecology

## 2021-05-06 ENCOUNTER — Other Ambulatory Visit: Payer: Self-pay | Admitting: Obstetrics and Gynecology

## 2021-05-06 DIAGNOSIS — R928 Other abnormal and inconclusive findings on diagnostic imaging of breast: Secondary | ICD-10-CM

## 2021-05-06 DIAGNOSIS — R921 Mammographic calcification found on diagnostic imaging of breast: Secondary | ICD-10-CM

## 2021-05-07 MED ORDER — BUPIVACAINE HCL 0.25 % IJ SOLN
6.0000 mL | INTRAMUSCULAR | Status: AC | PRN
Start: 2021-05-01 — End: 2021-05-01
  Administered 2021-05-01: 6 mL via INTRA_ARTICULAR

## 2021-05-07 MED ORDER — METHYLPREDNISOLONE ACETATE 40 MG/ML IJ SUSP
40.0000 mg | INTRAMUSCULAR | Status: AC | PRN
  Administered 2021-05-01: 40 mg via INTRA_ARTICULAR

## 2021-05-07 MED ORDER — LIDOCAINE HCL 1 % IJ SOLN
3.0000 mL | INTRAMUSCULAR | Status: AC | PRN
  Administered 2021-05-01: 3 mL

## 2021-05-10 ENCOUNTER — Encounter
Admission: RE | Disposition: A | Discharge status: Home / Self Care | Payer: Self-pay | Source: Home / Self Care | Attending: Gastroenterology

## 2021-05-10 ENCOUNTER — Ambulatory Visit
Admission: RE | Admit: 2021-05-10 | Discharge: 2021-05-10 | Disposition: A | Discharge status: Home / Self Care | Payer: BC Managed Care – PPO | Attending: Gastroenterology | Admitting: Gastroenterology

## 2021-05-10 ENCOUNTER — Encounter: Payer: Self-pay | Admitting: *Deleted

## 2021-05-10 ENCOUNTER — Ambulatory Visit: Payer: BC Managed Care – PPO | Admitting: Anesthesiology

## 2021-05-10 DIAGNOSIS — K5730 Diverticulosis of large intestine without perforation or abscess without bleeding: Secondary | ICD-10-CM | POA: Diagnosis not present

## 2021-05-10 DIAGNOSIS — Z1211 Encounter for screening for malignant neoplasm of colon: Secondary | ICD-10-CM | POA: Insufficient documentation

## 2021-05-10 DIAGNOSIS — K640 First degree hemorrhoids: Secondary | ICD-10-CM | POA: Insufficient documentation

## 2021-05-10 HISTORY — PX: COLONOSCOPY WITH PROPOFOL: SHX5780

## 2021-05-10 LAB — POCT PREGNANCY, URINE: Preg Test, Ur: NEGATIVE

## 2021-05-10 SURGERY — COLONOSCOPY WITH PROPOFOL
Anesthesia: General

## 2021-05-10 MED ORDER — PROPOFOL 500 MG/50ML IV EMUL
INTRAVENOUS | Status: DC | PRN
  Administered 2021-05-10: 120 ug/kg/min via INTRAVENOUS

## 2021-05-10 MED ORDER — PROPOFOL 10 MG/ML IV BOLUS
INTRAVENOUS | Status: DC | PRN
  Administered 2021-05-10: 100 mg via INTRAVENOUS

## 2021-05-10 MED ORDER — SODIUM CHLORIDE 0.9 % IV SOLN
INTRAVENOUS | Status: DC
  Administered 2021-05-10: 1000 mL via INTRAVENOUS

## 2021-05-10 MED ORDER — PROPOFOL 500 MG/50ML IV EMUL
INTRAVENOUS | Status: AC
  Filled 2021-05-10: qty 50

## 2021-05-10 MED ORDER — DEXMEDETOMIDINE HCL IN NACL 200 MCG/50ML IV SOLN
INTRAVENOUS | Status: DC | PRN
  Administered 2021-05-10: 8 ug via INTRAVENOUS

## 2021-05-10 MED ORDER — LIDOCAINE HCL (PF) 2 % IJ SOLN
INTRAMUSCULAR | Status: AC
  Filled 2021-05-10: qty 5

## 2021-05-10 NOTE — Transfer of Care (Signed)
Immediate Anesthesia Transfer of Care Note  Patient: Jenna Raymond  Procedure(s) Performed: COLONOSCOPY WITH PROPOFOL  Patient Location: PACU and Endoscopy Unit  Anesthesia Type:General  Level of Consciousness: drowsy and patient cooperative  Airway & Oxygen Therapy: Patient Spontanous Breathing  Post-op Assessment: Report given to RN and Post -op Vital signs reviewed and stable  Post vital signs: Reviewed and stable  Last Vitals:  Vitals Value Taken Time  BP 93/55 05/10/21 1050  Temp 36.4 C 05/10/21 1050  Pulse 85 05/10/21 1050  Resp 16 05/10/21 1051  SpO2 95 % 05/10/21 1050  Vitals shown include unvalidated device data.  Last Pain:  Vitals:   05/10/21 1050  TempSrc: Temporal  PainSc: Asleep         Complications: No notable events documented.

## 2021-05-10 NOTE — H&P (Signed)
[facility] Gastroenterology Pre-Procedure H&P   Patient ID: Jenna Raymond is a 45 y.o. female.  Gastroenterology Provider: Jaynie Collins, DO  Referring Provider:  Dr. Burnadette Pop PCP: [facility]  Date: 05/10/2021  HPI Jenna Raymond is a 45 y.o. female who presents today for Colonoscopy for initial screening colonoscopy. History is garnered via chart review and interpreter at bedside.  She denies any current Gi symptoms.   Past Medical History:  Diagnosis Date   Hypertension     Past Surgical History:  Procedure Laterality Date   CESAREAN SECTION      Family History No h/o GI disease or malignancy  Review of Systems  Constitutional:  Negative for activity change, appetite change, fatigue and fever.  HENT:  Negative for trouble swallowing and voice change.   Respiratory:  Negative for shortness of breath and wheezing.   Cardiovascular:  Negative for chest pain and palpitations.  Gastrointestinal:  Negative for abdominal distention, abdominal pain, anal bleeding, blood in stool, constipation, diarrhea, nausea, rectal pain and vomiting.  Musculoskeletal:  Negative for arthralgias and myalgias.  Skin:  Negative for color change and pallor.  Neurological:  Negative for dizziness, syncope and weakness.  Psychiatric/Behavioral:  Negative for confusion.   All other systems reviewed and are negative.   Medications No current facility-administered medications on file prior to encounter.   Current Outpatient Medications on File Prior to Encounter  Medication Sig Dispense Refill   albuterol (VENTOLIN HFA) 108 (90 Base) MCG/ACT inhaler Inhale 2 puffs into the lungs every 6 (six) hours as needed for wheezing or shortness of breath. Suspected COVID 18 g 0   ondansetron (ZOFRAN) 4 MG tablet Take 1 tablet (4 mg total) by mouth every 8 (eight) hours as needed. 20 tablet 0    Pertinent medications related to GI and procedure were  reviewed by me with the patient prior to the procedure  No current facility-administered medications for this encounter.      No Known Allergies Allergies were reviewed by me prior to the procedure  Objective    Vitals:   05/10/21 0957  BP: 127/76  Pulse: (!) 101  Resp: 20  Temp: 97.7 F (36.5 C)  TempSrc: Temporal  SpO2: 98%  Weight: 75.4 kg  Height: 5\' 4"  (1.626 m)     Physical Exam Vitals and nursing note reviewed.  Constitutional:      General: She is not in acute distress.    Appearance: Normal appearance. She is not ill-appearing, toxic-appearing or diaphoretic.  HENT:     Head: Normocephalic and atraumatic.     Nose: Nose normal.     Mouth/Throat:     Mouth: Mucous membranes are moist.     Pharynx: Oropharynx is clear.  Eyes:     General: No scleral icterus.    Extraocular Movements: Extraocular movements intact.  Cardiovascular:     Rate and Rhythm: Regular rhythm. Tachycardia present.     Heart sounds: Normal heart sounds. No murmur heard.   No friction rub. No gallop.  Pulmonary:     Effort: Pulmonary effort is normal. No respiratory distress.     Breath sounds: Normal breath sounds. No wheezing, rhonchi or rales.  Abdominal:     General: Abdomen is flat. Bowel sounds are normal. There is no distension.     Palpations: Abdomen is soft.     Tenderness: no abdominal tenderness There is no guarding or rebound.  Musculoskeletal:     Cervical  back: Neck supple.     Right lower leg: No edema.     Left lower leg: No edema.  Skin:    General: Skin is warm and dry.     Coloration: Skin is not jaundiced or pale.  Neurological:     General: No focal deficit present.     Mental Status: She is alert and oriented to person, place, and time. Mental status is at baseline.  Psychiatric:        Mood and Affect: Mood normal.        Behavior: Behavior normal.        Thought Content: Thought content normal.        Judgment: Judgment normal.     Assessment:   Jenna Raymond is a 45 y.o. female  who presents today for Colonoscopy for initial screening colonoscopy.  Plan:  Colonoscopy with possible intervention today Information and consent is garnered with the assistance of a Spanish interpreter  Colonoscopy with possible biopsy, control of bleeding, polypectomy, and interventions as necessary has been discussed with the patient/patient representative. Informed consent was obtained from the patient/patient representative after explaining the indication, nature, and risks of the procedure including but not limited to death, bleeding, perforation, missed neoplasm/lesions, cardiorespiratory compromise, and reaction to medications. Opportunity for questions was given and appropriate answers were provided. Patient/patient representative has verbalized understanding is amenable to undergoing the procedure.  Jaynie Collins, DO  [facility] Gastroenterology  Portions of the record may have been created with voice recognition software. Occasional wrong-word or 'sound-a-like' substitutions may have occurred due to the inherent limitations of voice recognition software.  Read the chart carefully and recognize, using context, where substitutions may have occurred.

## 2021-05-10 NOTE — Op Note (Signed)
[facility] Gastroenterology Patient Name: Jenna Raymond Procedure Date: 05/10/2021 10:00 AM MRN: [id number] Account #: [id number] Date of Birth: May 13, 1976 Admit Type: Outpatient Age: 45 Room: [facility] ENDO ROOM 1 Gender: Female Note Status: Finalized Procedure:             Colonoscopy Indications:           Screening for colorectal malignant neoplasm Providers:             Annamaria Helling DO, DO Referring MD:          No Local Md, MD (Referring MD) Medicines:             Monitored Anesthesia Care Complications:         No immediate complications. Estimated blood loss: None. Procedure:             Pre-Anesthesia Assessment:                        - Prior to the procedure, a History and Physical was                         performed, and patient medications and allergies were                         reviewed. The patient is competent. The risks and                         benefits of the procedure and the sedation options and                         risks were discussed with the patient via a spanish                         interpreter. All questions were answered and informed                         consent was obtained via a [place] interpreter.                         Patient identification and proposed procedure were                         verified by the physician, the nurse, the anesthetist                         and the technician in the endoscopy suite. Mental                         Status Examination: alert and oriented. Airway                         Examination: normal oropharyngeal airway and neck                         mobility. Respiratory Examination: clear to                         auscultation. CV Examination: tachycardia noted.  Prophylactic Antibiotics: The patient does not require                         prophylactic antibiotics. Prior Anticoagulants: The                         patient has taken no previous  anticoagulant or                         antiplatelet agents. ASA Grade Assessment: II - A                         patient with mild systemic disease. After reviewing                         the risks and benefits, the patient was deemed in                         satisfactory condition to undergo the procedure. The                         anesthesia plan was to use monitored anesthesia care                         (MAC). Immediately prior to administration of                         medications, the patient was re-assessed for adequacy                         to receive sedatives. The heart rate, respiratory                         rate, oxygen saturations, blood pressure, adequacy of                         pulmonary ventilation, and response to care were                         monitored throughout the procedure. The physical                         status of the patient was re-assessed after the                         procedure.                        After obtaining informed consent, the colonoscope was                         passed under direct vision. Throughout the procedure,                         the patient's blood pressure, pulse, and oxygen                         saturations were monitored continuously. The  Colonoscope was introduced through the anus and                         advanced to the the cecum, identified by appendiceal                         orifice and ileocecal valve. The colonoscopy was                         performed without difficulty. The patient tolerated                         the procedure well. The quality of the bowel                         preparation was evaluated using the BBPS [facility] Bowel                         Preparation Scale) with scores of: Right Colon = 3,                         Transverse Colon = 3 and Left Colon = 3 (entire mucosa                         seen well with no residual staining, small fragments                          of stool or opaque liquid). The total BBPS score                         equals 9. The ileocecal valve, appendiceal orifice,                         and rectum were photographed. Findings:      The perianal and digital rectal examinations were normal. Pertinent       negatives include normal sphincter tone.      Multiple small-mouthed diverticula were found in the left colon.       Estimated blood loss: none.      Non-bleeding internal hemorrhoids were found during retroflexion. The       hemorrhoids were Grade I (internal hemorrhoids that do not prolapse).       Estimated blood loss: none.      The exam was otherwise without abnormality on direct and retroflexion       views. Impression:            - Diverticulosis in the left colon.                        - Non-bleeding internal hemorrhoids.                        - The examination was otherwise normal on direct and                         retroflexion views.                        - No specimens collected. Recommendation:        -  Discharge patient to home.                        - Resume previous diet.                        - Continue present medications.                        - Repeat colonoscopy in 10 years for screening                         purposes.                        - Return to referring physician as previously                         scheduled. Procedure Code(s):     --- Professional ---                        [phone], Colonoscopy, flexible; diagnostic, including                         collection of specimen(s) by brushing or washing, when                         performed (separate procedure) Diagnosis Code(s):     --- Professional ---                        Z12.11, Encounter for screening for malignant neoplasm                         of colon                        K64.0, First degree hemorrhoids                        K57.30, Diverticulosis of large intestine without                          perforation or abscess without bleeding CPT copyright 2019 American Medical Association. All rights reserved. The codes documented in this report are preliminary and upon coder review may  be revised to meet current compliance requirements. Attending Participation:      I personally performed the entire procedure. Volney American, DO Annamaria Helling DO, DO 05/10/2021 10:51:13 AM This report has been signed electronically. Number of Addenda: 0 Note Initiated On: 05/10/2021 10:00 AM Scope Withdrawal Time: 0 hours 10 minutes 49 seconds  Total Procedure Duration: 0 hours 18 minutes 26 seconds  Estimated Blood Loss:  Estimated blood loss: none.      [facility]

## 2021-05-10 NOTE — Anesthesia Preprocedure Evaluation (Signed)
Anesthesia Evaluation  Patient identified by MRN, date of birth, ID band Patient awake    Reviewed: Allergy & Precautions, NPO status , Patient's Chart, lab work & pertinent test results  History of Anesthesia Complications Negative for: history of anesthetic complications  Airway Mallampati: III  TM Distance: <3 FB Neck ROM: full    Dental  (+) Chipped, Poor Dentition, Missing   Pulmonary neg pulmonary ROS, neg shortness of breath,    Pulmonary exam normal        Cardiovascular Exercise Tolerance: Good hypertension, (-) angina(-) Past MI Normal cardiovascular exam     Neuro/Psych negative neurological ROS  negative psych ROS   GI/Hepatic negative GI ROS, Neg liver ROS, neg GERD  ,  Endo/Other  negative endocrine ROS  Renal/GU negative Renal ROS  negative genitourinary   Musculoskeletal   Abdominal   Peds  Hematology negative hematology ROS (+)   Anesthesia Other Findings Past Medical History: No date: Hypertension  Past Surgical History: No date: CESAREAN SECTION     Reproductive/Obstetrics negative OB ROS                             Anesthesia Physical Anesthesia Plan  ASA: 2  Anesthesia Plan: General   Post-op Pain Management:    Induction: Intravenous  PONV Risk Score and Plan: Propofol infusion and TIVA  Airway Management Planned: Natural Airway and Nasal Cannula  Additional Equipment:   Intra-op Plan:   Post-operative Plan:   Informed Consent: I have reviewed the patients History and Physical, chart, labs and discussed the procedure including the risks, benefits and alternatives for the proposed anesthesia with the patient or authorized representative who has indicated his/her understanding and acceptance.     Dental Advisory Given and Interpreter used for interveiw  Plan Discussed with: Anesthesiologist, CRNA and Surgeon  Anesthesia Plan Comments:  (Patient consented for risks of anesthesia including but not limited to:  - adverse reactions to medications - risk of airway placement if required - damage to eyes, teeth, lips or other oral mucosa - nerve damage due to positioning  - sore throat or hoarseness - Damage to heart, brain, nerves, lungs, other parts of body or loss of life  Patient voiced understanding.)        Anesthesia Quick Evaluation

## 2021-05-10 NOTE — Anesthesia Postprocedure Evaluation (Signed)
Anesthesia Post Note  Patient: Jenna Raymond  Procedure(s) Performed: COLONOSCOPY WITH PROPOFOL  Patient location during evaluation: Endoscopy Anesthesia Type: General Level of consciousness: awake and alert Pain management: pain level controlled Vital Signs Assessment: post-procedure vital signs reviewed and stable Respiratory status: spontaneous breathing, nonlabored ventilation, respiratory function stable and patient connected to nasal cannula oxygen Cardiovascular status: blood pressure returned to baseline and stable Postop Assessment: no apparent nausea or vomiting Anesthetic complications: no   No notable events documented.   Last Vitals:  Vitals:   05/10/21 0957 05/10/21 1050  BP: 127/76 (!) 93/55  Pulse: (!) 101 85  Resp: 20 15  Temp: 36.5 C 36.4 C  SpO2: 98% 95%    Last Pain:  Vitals:   05/10/21 1050  TempSrc: Temporal  PainSc: Asleep                 Cleda Mccreedy Janara Klett

## 2021-05-10 NOTE — Interval H&P Note (Signed)
History and Physical Interval Note: Preprocedure H&P from 05/10/21  was reviewed and there was no interval change after seeing and examining the patient.  Written consent was obtained from the patient after discussion of risks, benefits, and alternatives. Patient has consented to proceed with Colonoscopy with possible intervention   05/10/2021 10:21 AM  Addis Grier Rocher [place] Mangle  has presented today for surgery, with the diagnosis of Z12.11 Colon Cancer Screening.  The various methods of treatment have been discussed with the patient and family. After consideration of risks, benefits and other options for treatment, the patient has consented to  Procedure(s) with comments: COLONOSCOPY WITH PROPOFOL (N/A) - SPANISH INTERPRETER as a surgical intervention.  The patient's history has been reviewed, patient examined, no change in status, stable for surgery.  I have reviewed the patient's chart and labs.  Questions were answered to the patient's satisfaction.     Jaynie Collins

## 2021-05-13 ENCOUNTER — Encounter: Payer: Self-pay | Admitting: Gastroenterology

## 2021-05-22 ENCOUNTER — Ambulatory Visit: Payer: BC Managed Care – PPO | Admitting: Surgery

## 2021-05-30 ENCOUNTER — Ambulatory Visit (INDEPENDENT_AMBULATORY_CARE_PROVIDER_SITE_OTHER): Payer: BC Managed Care – PPO | Admitting: Surgery

## 2021-05-30 ENCOUNTER — Other Ambulatory Visit: Payer: Self-pay

## 2021-05-30 DIAGNOSIS — G8929 Other chronic pain: Secondary | ICD-10-CM

## 2021-05-30 DIAGNOSIS — M25561 Pain in right knee: Secondary | ICD-10-CM

## 2021-05-30 NOTE — Progress Notes (Signed)
   Office Visit Note   Patient: Jenna Raymond [place] Mangle           Date of Birth: September 16, 1976           MRN: [id number] Visit Date: 05/30/2021              Requested by: [facility] [address] [place],  [place] [address] PCP: [facility]   Assessment & Plan: Visit Diagnoses:  1. Chronic pain of right knee     Plan: Advised patient on please about her pain relief.  With the help of interpreter patient was advised to follow-up with me in 2 months for recheck.  If she still continues to be pain-free she may call and cancel that appointment.  Within the next several weeks if her pain returns she will also call to let me know and I will go ahead and order right knee MRI at that time.  All questions answered.  Follow-Up Instructions: Return in about 2 months (around 07/30/2021) for with Deniz Hannan recheck right knee.   Orders:  No orders of the defined types were placed in this encounter.  No orders of the defined types were placed in this encounter.     Procedures: No procedures performed   Clinical Data: No additional findings.   Subjective: Chief Complaint  Patient presents with   Right Knee - Follow-up    HPI 45 year old Hispanic female who returns for recheck of her right knee pain.  Patient comes in with interpreter.  Last office visit intra-articular Marcaine/Depo-Medrol injection was performed and patient states that she has complete relief from that injection.  Arthritis panel unremarkable.  Sed rate was little elevated at 43.   Objective: Vital Signs: There were no vitals taken for this visit.  Physical Exam Very pleasant female alert and oriented in no acute distress.  Gait is normal.  Right knee good range of motion.  Ligament stable.  No pain with McMurray's testing.  Mild patellofemoral crepitus.  Knee nontender. Ortho Exam  Specialty Comments:  No specialty comments available.  Imaging: No results found.   PMFS History: There  are no problems to display for this patient.  Past Medical History:  Diagnosis Date   Hypertension     Family History  Problem Relation Age of Onset   Breast cancer Paternal Aunt     Past Surgical History:  Procedure Laterality Date   CESAREAN SECTION     COLONOSCOPY WITH PROPOFOL N/A 05/10/2021   Procedure: COLONOSCOPY WITH PROPOFOL;  Surgeon: Jaynie Collins, DO;  Location: [facility] ENDOSCOPY;  Service: Gastroenterology;  Laterality: N/A;  SPANISH INTERPRETER   Social History   Occupational History   Not on file  Tobacco Use   Smoking status: Never   Smokeless tobacco: Never  Vaping Use   Vaping Use: Never used  Substance and Sexual Activity   Alcohol use: Never   Drug use: Never   Sexual activity: Not on file

## 2021-07-16 ENCOUNTER — Other Ambulatory Visit: Payer: Self-pay | Admitting: Obstetrics and Gynecology

## 2021-07-16 DIAGNOSIS — R921 Mammographic calcification found on diagnostic imaging of breast: Secondary | ICD-10-CM

## 2021-07-31 ENCOUNTER — Ambulatory Visit: Payer: BC Managed Care – PPO | Admitting: Surgery

## 2021-11-06 ENCOUNTER — Other Ambulatory Visit: Payer: Self-pay

## 2021-11-06 ENCOUNTER — Ambulatory Visit
Admission: RE | Admit: 2021-11-06 | Discharge: 2021-11-06 | Disposition: A | Discharge status: Home / Self Care | Payer: BC Managed Care – PPO | Source: Ambulatory Visit | Attending: Obstetrics and Gynecology | Admitting: Obstetrics and Gynecology

## 2021-11-06 DIAGNOSIS — R921 Mammographic calcification found on diagnostic imaging of breast: Secondary | ICD-10-CM | POA: Insufficient documentation

## 2021-12-03 ENCOUNTER — Other Ambulatory Visit: Payer: Self-pay | Admitting: Family Medicine

## 2021-12-03 DIAGNOSIS — R921 Mammographic calcification found on diagnostic imaging of breast: Secondary | ICD-10-CM

## 2021-12-14 ENCOUNTER — Other Ambulatory Visit: Payer: Self-pay

## 2021-12-14 ENCOUNTER — Ambulatory Visit
Admission: EM | Admit: 2021-12-14 | Discharge: 2021-12-14 | Disposition: A | Discharge status: Home / Self Care | Payer: BC Managed Care – PPO

## 2021-12-14 DIAGNOSIS — R42 Dizziness and giddiness: Secondary | ICD-10-CM

## 2021-12-14 MED ORDER — FLUTICASONE PROPIONATE 50 MCG/ACT NA SUSP: 1.0000 | Freq: Two times a day (BID) | NASAL | 0 refills | Status: AC

## 2021-12-14 MED ORDER — MECLIZINE HCL 25 MG PO TABS: 25.0000 mg | ORAL_TABLET | Freq: Three times a day (TID) | ORAL | 0 refills | Status: AC | PRN

## 2021-12-14 NOTE — ED Triage Notes (Signed)
Pt present dizziness, nausea and hot flashes. Symptoms started two days ago. Pt state the dizziness comes and goes.  ?

## 2021-12-14 NOTE — ED Provider Notes (Signed)
? ?[facility] ?Provider Note ? ?Patient Contact: 8:28 AM (approximate) ? ? ?History  ? ?Nausea and Dizziness ? ? ?HPI ? ?Jenna Raymond is a 46 y.o. female who presents the emergency department complaining of dizziness.  Patient was diagnosed with a viral illness 2 days ago, she had congestion, nausea, vomiting, diarrhea, fevers and chills.  Patient had a negative COVID, flu swab.  Patient was given Zofran which has calm down most of her symptoms but she still feels dizzy.  She states that she has had vertigo in the past and feels like things are spinning at this time.  Patient is requesting medications to help with the dizziness at this time.  No other complaints such as chest pain, shortness of breath, headache, visual changes, unilateral weakness, difficulty formulating thoughts or words. ?  ? ? ?Physical Exam  ? ?Triage Vital Signs: ?ED Triage Vitals [12/14/21 0824]  ?Enc Vitals Group  ?   BP (!) 148/91  ?   Pulse Rate 80  ?   Resp 16  ?   Temp 98.2 ?F (36.8 ?C)  ?   Temp Source Oral  ?   SpO2 96 %  ?   Weight   ?   Height   ?   Head Circumference   ?   Peak Flow   ?   Pain Score   ?   Pain Loc   ?   Pain Edu?   ?   Excl. in [place]?   ? ? ?Most recent vital signs: ?Vitals:  ? 12/14/21 0824  ?BP: (!) 148/91  ?Pulse: 80  ?Resp: 16  ?Temp: 98.2 ?F (36.8 ?C)  ?SpO2: 96%  ? ? ? ?General: Alert and in no acute distress. ?ENT: ?     Ears: EACs with cerumen but otherwise unremarkable bilaterally.  TMs are moderately bulging with no injection bilaterally. ?     Nose: No congestion/rhinnorhea. ?     Mouth/Throat: Mucous membranes are moist. ?Neck: No stridor. No cervical spine tenderness to palpation. ?Hematological/Lymphatic/Immunilogical: No cervical lymphadenopathy. ?Cardiovascular:  Good peripheral perfusion ?Respiratory: Normal respiratory effort without tachypnea or retractions. Lungs CTAB. Good air entry to the bases with no decreased or absent breath sounds. ?Musculoskeletal:  Full range of motion to all extremities.  ?Neurologic:  No gross focal neurologic deficits are appreciated.  Cranial nerves II through XII grossly intact. ?Skin:   No rash noted ?Other: ? ? ?ED Results / Procedures / Treatments  ? ?Labs ?(all labs ordered are listed, but only abnormal results are displayed) ?Labs Reviewed - No data to display ? ? ?EKG ? ? ? ? ?RADIOLOGY ? ? ? ?No results found. ? ?PROCEDURES: ? ? ? ?MEDICATIONS ORDERED IN ED: ?Medications - No data to display ? ? ?IMPRESSION / MDM / ASSESSMENT AND PLAN / ED COURSE  ?I reviewed the triage vital signs and the nursing notes. ?             ?               ? ?Differential diagnosis includes, but is not limited to, vertigo, sinusitis, viral illness ? ? ?Patient's diagnosis is consistent with vertigo.  Patient presents emergency department with dizziness following viral illness.  Patient does have a history of vertigo but is only taking Zofran currently for her nausea and vomiting.  Majority patient's symptoms had improved but she is still dizzy and would like medications for her vertigo.  Exam was reassuring.  No indication for labs or imaging currently.  Patient will be prescribed meclizine and Flonase at this time.  Follow-up primary care as needed. Patient is given ED precautions to return to the ED for any worsening or new symptoms. ? ? ? ?  ? ? ?FINAL CLINICAL IMPRESSION(S) / ED DIAGNOSES  ? ?Final diagnoses:  ?Vertigo  ? ? ? ?Rx / DC Orders  ? ?ED Discharge Orders   ? ?      Ordered  ?  fluticasone (FLONASE) 50 MCG/ACT nasal spray  2 times daily       ? 12/14/21 0850  ?  meclizine (ANTIVERT) 25 MG tablet  3 times daily PRN       ? 12/14/21 0850  ? ?  ?  ? ?  ? ? ? ?Note:  This document was prepared using Dragon voice recognition software and may include unintentional dictation errors. ?  ?Darletta Moll, PA-C ?12/14/21 [id number] ? ?

## 2022-04-04 ENCOUNTER — Emergency Department
Admission: EM | Admit: 2022-04-04 | Discharge: 2022-04-04 | Disposition: A | Discharge status: Home / Self Care | Payer: BC Managed Care – PPO | Attending: Emergency Medicine | Admitting: Emergency Medicine

## 2022-04-04 ENCOUNTER — Other Ambulatory Visit: Payer: Self-pay

## 2022-04-04 DIAGNOSIS — R1110 Vomiting, unspecified: Secondary | ICD-10-CM | POA: Insufficient documentation

## 2022-04-04 DIAGNOSIS — R531 Weakness: Secondary | ICD-10-CM | POA: Insufficient documentation

## 2022-04-04 DIAGNOSIS — R42 Dizziness and giddiness: Secondary | ICD-10-CM | POA: Insufficient documentation

## 2022-04-04 HISTORY — DX: Dizziness and giddiness: R42

## 2022-04-04 LAB — COMPREHENSIVE METABOLIC PANEL
ALT: 41 U/L (ref 0–44)
AST: 39 U/L (ref 15–41)
Albumin: 4.4 g/dL (ref 3.5–5.0)
Alkaline Phosphatase: 97 U/L (ref 38–126)
Anion gap: 7 (ref 5–15)
BUN: 15 mg/dL (ref 6–20)
CO2: 29 mmol/L (ref 22–32)
Calcium: 9.9 mg/dL (ref 8.9–10.3)
Chloride: 103 mmol/L (ref 98–111)
Creatinine, Ser: 0.66 mg/dL (ref 0.44–1.00)
GFR, Estimated: >60 mL/min (ref >60)
Glucose, Bld: 129 mg/dL — ABNORMAL HIGH (ref 70–99)
Potassium: 4.5 mmol/L (ref 3.5–5.1)
Sodium: 139 mmol/L (ref 135–145)
Total Bilirubin: 0.7 mg/dL (ref 0.3–1.2)
Total Protein: 8.7 g/dL — ABNORMAL HIGH (ref 6.5–8.1)

## 2022-04-04 LAB — TROPONIN I (HIGH SENSITIVITY): Troponin I (High Sensitivity): 3 ng/L (ref <18)

## 2022-04-04 LAB — CBC
HCT: 45.9 % (ref 36.0–46.0)
Hemoglobin: 14.8 g/dL (ref 12.0–15.0)
MCH: 27.4 pg (ref 26.0–34.0)
MCHC: 32.2 g/dL (ref 30.0–36.0)
MCV: 84.8 fL (ref 80.0–100.0)
Platelets: 312 10*3/uL (ref 150–400)
RBC: 5.41 MIL/uL — ABNORMAL HIGH (ref 3.87–5.11)
RDW: 13.5 % (ref 11.5–15.5)
WBC: 10.9 10*3/uL — ABNORMAL HIGH (ref 4.0–10.5)
nRBC: 0 % (ref 0.0–0.2)

## 2022-04-04 LAB — LIPASE, BLOOD: Lipase: 31 U/L (ref 11–51)

## 2022-04-04 MED ORDER — MECLIZINE HCL 25 MG PO TABS: 25.0000 mg | ORAL_TABLET | Freq: Three times a day (TID) | ORAL | 0 refills | Status: AC | PRN

## 2022-04-04 MED ORDER — MECLIZINE HCL 25 MG PO TABS
25.0000 mg | ORAL_TABLET | Freq: Once | ORAL | Status: AC
  Administered 2022-04-04: 25 mg via ORAL
  Filled 2022-04-04: qty 1

## 2022-04-04 MED ORDER — SODIUM CHLORIDE 0.9 % IV BOLUS
1000.0000 mL | Freq: Once | INTRAVENOUS | Status: AC
  Administered 2022-04-04: 1000 mL via INTRAVENOUS

## 2022-04-04 NOTE — ED Notes (Signed)
Pt feels better. Given sandwich tray and water.

## 2022-04-04 NOTE — ED Triage Notes (Signed)
Pt to ED with sister, they were walking in store today and suddenly felt dizzy and weak and vomited 3 times at 1140. Sister states her face became pale. Pt states she feels weak. Has only eaten a few crackers today and coffee.  Denies SOB. Denies vision changes. Pt still dizzy. States has hx vertigo.

## 2022-04-04 NOTE — ED Provider Notes (Signed)
[facility] Provider Note    Event Date/Time   First MD Initiated Contact with Patient 04/04/22 1806     (approximate)   History   Dizziness and Emesis   HPI Hospital interpreter utilized during H and P Jenna Raymond is a 46 y.o. female  who presents to the emergency department today because of concern for dizziness, vomiting and weakness. The patient states the symptoms started this morning. They started suddenly. Had three episodes of vomiting. The patient says she has history of vertigo and this reminds her of previous episodes. Had a prescription for zofran that she tried taking without any significant relief.    Physical Exam   Triage Vital Signs: ED Triage Vitals  Enc Vitals Group     BP 04/04/22 1418 (!) 166/91     Pulse Rate 04/04/22 1418 86     Resp 04/04/22 1418 16     Temp 04/04/22 1418 98.6 F (37 C)     Temp Source 04/04/22 1418 Oral     SpO2 04/04/22 1418 96 %     Weight 04/04/22 1420 150 lb (68 kg)     Height 04/04/22 1420 5\' 4"  (1.626 m)     Head Circumference --      Peak Flow --      Pain Score 04/04/22 1419 0   Most recent vital signs: Vitals:   04/04/22 1418  BP: (!) 166/91  Pulse: 86  Resp: 16  Temp: 98.6 F (37 C)  SpO2: 96%    General: Awake, alert CV:  Good peripheral perfusion. Regular rate and rhythm. Resp:  Normal effort. Lungs clear. Abd:  No distention. Non tender.   ED Results / Procedures / Treatments   Labs (all labs ordered are listed, but only abnormal results are displayed) Labs Reviewed  COMPREHENSIVE METABOLIC PANEL - Abnormal; Notable for the following components:      Result Value   Glucose, Bld 129 (*)    Total Protein 8.7 (*)    All other components within normal limits  CBC - Abnormal; Notable for the following components:   WBC 10.9 (*)    RBC 5.41 (*)    All other components within normal limits  LIPASE, BLOOD  URINALYSIS, ROUTINE W REFLEX MICROSCOPIC  POC URINE  PREG, ED  TROPONIN I (HIGH SENSITIVITY)  TROPONIN I (HIGH SENSITIVITY)     EKG  I, 04/06/22, attending physician, personally viewed and interpreted this EKG  EKG Time: 1433 Rate: 76 Rhythm: normal sinus rhythm Axis: normal Intervals: qtc 447 QRS: narrow ST changes: no st elevation Impression: normal ekg  RADIOLOGY None   PROCEDURES:  Critical Care performed: No  Procedures   MEDICATIONS ORDERED IN ED: Medications - No data to display   IMPRESSION / MDM / ASSESSMENT AND PLAN / ED COURSE  I reviewed the triage vital signs and the nursing notes.                              Differential diagnosis includes, but is not limited to, vertigo, anemia, electrolyte abnormality  Patient's presentation is most consistent with acute presentation with potential threat to life or bodily function.  Patient presented to the emergency department today because of concerns for dizziness weakness and vomiting.  Patient states she does have a history of vertigo.  Patient's blood work here without concerning leukocytosis electrolyte abnormality or anemia.  EKG without concerning arrhythmia.  Patient did feel improvement after meclizine.  This time I do think likely vertigo.  Will give patient prescription for meclizine. FINAL CLINICAL IMPRESSION(S) / ED DIAGNOSES   Final diagnoses:  Vertigo     Note:  This document was prepared using Dragon voice recognition software and may include unintentional dictation errors.    Phineas Semen, MD 04/04/22 [phone]

## 2022-04-04 NOTE — Discharge Instructions (Signed)
Please seek medical attention for any high fevers, chest pain, shortness of breath, change in behavior, persistent vomiting, bloody stool or any other new or concerning symptoms.  

## 2022-04-04 NOTE — ED Provider Triage Note (Signed)
**Note Raymond-Identified via Obfuscation** Emergency Medicine Provider Triage Evaluation Note  Jenna Raymond , a 46 y.o. female  was evaluated in triage.  Pt complains of near syncope with nausea and one episode of vomiting. No feels tired and like she is going to pass out. Started around 11am today.  Physical Exam  BP (!) 166/91   Pulse 86   Temp 98.6 F (37 C) (Oral)   Resp 16   Ht 5\' 4"  (1.626 m)   Wt 68 kg   SpO2 96%   BMI 25.75 kg/m  Gen:   Awake, no distress   Resp:  Normal effort  MSK:   Moves extremities without difficulty  Other:    Medical Decision Making  Medically screening exam initiated at 2:34 PM.  Appropriate orders placed.  Jenna Raymond was informed that the remainder of the evaluation will be completed by another provider, this initial triage assessment does not replace that evaluation, and the importance of remaining in the ED until their evaluation is complete.    Jenna Mangle, FNP 04/04/22 1435

## 2022-04-17 ENCOUNTER — Ambulatory Visit
Admission: RE | Admit: 2022-04-17 | Discharge: 2022-04-17 | Disposition: A | Discharge status: Home / Self Care | Payer: BC Managed Care – PPO | Source: Ambulatory Visit | Attending: Family Medicine | Admitting: Family Medicine

## 2022-04-17 DIAGNOSIS — R921 Mammographic calcification found on diagnostic imaging of breast: Secondary | ICD-10-CM | POA: Diagnosis not present

## 2022-04-24 ENCOUNTER — Other Ambulatory Visit: Payer: Self-pay | Admitting: Physician Assistant

## 2022-04-24 DIAGNOSIS — H9311 Tinnitus, right ear: Secondary | ICD-10-CM

## 2022-04-24 DIAGNOSIS — R42 Dizziness and giddiness: Secondary | ICD-10-CM

## 2022-05-13 ENCOUNTER — Other Ambulatory Visit: Payer: BC Managed Care – PPO

## 2022-05-13 ENCOUNTER — Ambulatory Visit
Admission: RE | Admit: 2022-05-13 | Discharge: 2022-05-13 | Disposition: A | Discharge status: Home / Self Care | Payer: BC Managed Care – PPO | Source: Ambulatory Visit | Attending: Physician Assistant | Admitting: Physician Assistant

## 2022-05-13 DIAGNOSIS — R42 Dizziness and giddiness: Secondary | ICD-10-CM

## 2022-05-13 DIAGNOSIS — H9311 Tinnitus, right ear: Secondary | ICD-10-CM

## 2023-03-31 ENCOUNTER — Other Ambulatory Visit: Payer: Self-pay | Admitting: Nurse Practitioner

## 2023-03-31 DIAGNOSIS — Z1231 Encounter for screening mammogram for malignant neoplasm of breast: Secondary | ICD-10-CM

## 2023-04-07 ENCOUNTER — Ambulatory Visit: Payer: BC Managed Care – PPO | Admitting: Sports Medicine

## 2023-04-22 ENCOUNTER — Ambulatory Visit
Admission: RE | Admit: 2023-04-22 | Discharge: 2023-04-22 | Disposition: A | Discharge status: Home / Self Care | Payer: BC Managed Care – PPO | Source: Ambulatory Visit | Attending: Nurse Practitioner | Admitting: Nurse Practitioner

## 2023-04-22 DIAGNOSIS — Z1231 Encounter for screening mammogram for malignant neoplasm of breast: Secondary | ICD-10-CM | POA: Diagnosis present

## 2023-04-27 ENCOUNTER — Ambulatory Visit: Payer: BC Managed Care – PPO | Admitting: Sports Medicine

## 2023-04-27 VITALS — BP 126/83 | HR 91

## 2023-04-27 DIAGNOSIS — M25552 Pain in left hip: Secondary | ICD-10-CM

## 2023-04-27 DIAGNOSIS — M25511 Pain in right shoulder: Secondary | ICD-10-CM | POA: Diagnosis not present

## 2023-04-27 DIAGNOSIS — G8929 Other chronic pain: Secondary | ICD-10-CM

## 2023-04-27 DIAGNOSIS — M19011 Primary osteoarthritis, right shoulder: Secondary | ICD-10-CM | POA: Diagnosis not present

## 2023-04-27 NOTE — Progress Notes (Signed)
Jenna Raymond - 47 y.o. female MRN [id number]  Date of birth: [birth year]  Office Visit Note: Visit Date: 04/27/2023 PCP: Marisue Ivan, MD Referred by: Marisue Ivan, MD  Subjective: Chief Complaint  Patient presents with   Right Shoulder - Pain    Patient complains of right shoulder pain x 8 months. NKI. Hurts all the time, but hurts worse with activities such as removing her jacket. Cannot sleep on the right side due to pain. Received a cortisone injections 3 months ago at the [facility] and some meloxicam. The injection wore off after 2 day. The Meloxicam hurt her stomach. Takes Advil/Tylenol now -- sometimes works.   Left Hip - Pain    Pain lateral hip ---She has been lying on that left side at night, since the shoulder keeps her from lying on the right. The patient can point directly to the area that hurts. She describes it as burning pain.   HPI: Jenna Raymond is a pleasant 47 y.o. female who presents today for evaluation of chronic right shoulder pain, left lateral hip pain.  Patient use of an in person Spanish interpreter was used throughout the entirety of the visit today.  Note reviewed from [facility] (Dr. Rosita Kea) from 02/19/23 by myself today.  Had tried 3 different medications that were not helpful.  Had swelling results report reviewed which showed evidence of impingement with sclerosis at the tuberosity but no acute fracture.  They did proceed with a subacromial joint injection at that time.  Today, [name] tells me she has had right shoulder pain for 3 months or more.  She reports she did not get much relief from the subacromial joint injection.  Has meloxicam 15 mg but at times this will bother her stomach.  Has tried ibuprofen and Tylenol as well without much relief.  Was shown some stretching exercises but has not had formalized physical therapy yet.  She also is having left lateral hip pain.  She has pain with laying on that  side, describes as a aching and burning pain.  Denies any numbness or tingling down into the toes.  She does occasionally report some tingling that goes down into the right thumb.  Pertinent ROS were reviewed with the patient and found to be negative unless otherwise specified above in HPI.   Assessment & Plan: Visit Diagnoses:  1. Chronic right shoulder pain   2. Arthritis of right acromioclavicular joint   3. Greater trochanteric pain syndrome of left lower extremity   4. Pain in left hip    Plan: Discussed with the patient today the possible cause of her right shoulder pain.  Previous x-ray did show AC joint arthritis and possible signs of impingement.  Her exam is somewhat nonspecific as she has reported pain moving the arm in all planes of motion, but more specific with impingement and rotator cuff provocative exams.  have any do not think this is coming from arthritis within the glenohumeral joint for frozen shoulder as she has full range of motion.  We will get her started in formalized physical therapy, but I would like to obtain an MRI of the shoulder given the chronicity and lack of improvement from subacromial joint injection which will help guide her management.  This was ordered today.  In terms of the left lateral hip, this is very indicative of greater trochanteric pain syndrome.  We will get her started in formalized physical therapy to work on strengthening.  We  could consider x-ray imaging or greater trochanteric injection in the future if not improving.  She does intermittently have some numbness and tingling in the thumb, we could always consider imaging of the cervical spine if not improving with shoulder treatments and/or her MRI does not show much of the shoulder.  But I have lower suspicion this is coming from the neck.  Follow-up: Return for F/u 1-week after MRI shoulder to review (30-min f/u for GT-inj).   Meds & Orders: No orders of the defined types were placed in this  encounter.   Orders Placed This Encounter  Procedures   MR SHOULDER RIGHT WO CONTRAST   Ambulatory referral to Physical Therapy     Procedures: No procedures performed      Clinical History: No specialty comments available.  She reports that she has never smoked. She has never used smokeless tobacco. No results for input(s): "HGBA1C", "LABURIC" in the last 8760 hours.  Objective:   Vital Signs: BP 126/83 (BP Location: Left Arm, Patient Position: Sitting, Cuff Size: Normal)   Pulse 91   Physical Exam  Gen: Well-appearing, in no acute distress; non-toxic CV:  Well-perfused. Warm.  Resp: Breathing unlabored on room air; no wheezing. Psych: Fluid speech in conversation; appropriate affect; normal thought process Neuro: Sensation intact throughout. No gross coordination deficits.   Ortho Exam - Right shoulder: There is some generalized TTP over the [facility] joint, lateral shoulder and at Codman's point but no specific tenderness to palpation.  There is full active and passive range of motion but with pain at endrange flexion, abduction and external rotation.  There is no blocks to movement.  She has pain with drop arm, empty can and resisted ER.  There is no gross weakness with rotator cuff testing.  Also has pain with crossarm abduction.  In general she has quite diffuse pain with all planes of motion.  Negative Spurling's test bilaterally.  - Left hip: Positive TTP over the greater trochanter.  There is 4/5 weakness with resisted hip abduction compared to 5/5 of the contralateral hip.  No mechanical blocks to range of motion with internal or external logroll.  Negative FADIR.  Imaging:  - 3-views of Right shoulder 02/19/23 - report reviewed by myself today: 02/19/2023 3:00 PM EDT   AP, scapular Y and Zanca view shows significant AC arthritis, maintained subacromial space, some evidence of impingement with some sclerosis at the tuberosity.  Impression is signs of chronic shoulder  impingement.   Past Medical/Family/Surgical/Social History: Medications & Allergies reviewed per EMR, new medications updated. There are no problems to display for this patient.  Past Medical History:  Diagnosis Date   Hypertension    Vertigo    Family History  Problem Relation Age of Onset   Breast cancer Paternal Aunt    Past Surgical History:  Procedure Laterality Date   CESAREAN SECTION     COLONOSCOPY WITH PROPOFOL N/A 05/10/2021   Procedure: COLONOSCOPY WITH PROPOFOL;  Surgeon: Jaynie Collins, DO;  Location: [facility] ENDOSCOPY;  Service: Gastroenterology;  Laterality: N/A;  SPANISH INTERPRETER   Social History   Occupational History   Not on file  Tobacco Use   Smoking status: Never   Smokeless tobacco: Never  Vaping Use   Vaping status: Never Used  Substance and Sexual Activity   Alcohol use: Never   Drug use: Never   Sexual activity: Not on file

## 2023-05-18 ENCOUNTER — Ambulatory Visit: Payer: BC Managed Care – PPO | Admitting: Sports Medicine

## 2023-06-02 ENCOUNTER — Encounter: Payer: Self-pay | Admitting: Sports Medicine

## 2023-06-02 ENCOUNTER — Ambulatory Visit (INDEPENDENT_AMBULATORY_CARE_PROVIDER_SITE_OTHER): Payer: BC Managed Care – PPO | Admitting: Sports Medicine

## 2023-06-02 ENCOUNTER — Other Ambulatory Visit: Payer: Self-pay

## 2023-06-02 VITALS — Ht 65.0 in | Wt 172.0 lb

## 2023-06-02 DIAGNOSIS — G8929 Other chronic pain: Secondary | ICD-10-CM | POA: Diagnosis not present

## 2023-06-02 DIAGNOSIS — M25552 Pain in left hip: Secondary | ICD-10-CM | POA: Diagnosis not present

## 2023-06-02 DIAGNOSIS — M19011 Primary osteoarthritis, right shoulder: Secondary | ICD-10-CM | POA: Diagnosis not present

## 2023-06-02 DIAGNOSIS — M25511 Pain in right shoulder: Secondary | ICD-10-CM

## 2023-06-02 MED ORDER — LIDOCAINE HCL 1 % IJ SOLN
2.0000 mL | INTRAMUSCULAR | Status: AC | PRN
  Administered 2023-06-02: 2 mL

## 2023-06-02 MED ORDER — BUPIVACAINE HCL 0.25 % IJ SOLN
2.0000 mL | INTRAMUSCULAR | Status: AC | PRN
  Administered 2023-06-02: 2 mL via INTRA_ARTICULAR

## 2023-06-02 MED ORDER — BETAMETHASONE SOD PHOS & ACET 6 (3-3) MG/ML IJ SUSP
6.0000 mg | INTRAMUSCULAR | Status: AC | PRN
  Administered 2023-06-02: 6 mg via INTRA_ARTICULAR

## 2023-06-02 NOTE — Progress Notes (Signed)
Patient states that her hip pain "burns" and feels the same as it did last visit. She states that she did not hear about scheduling for physical therapy. Patient also says that she tried to call to schedule her MRI for her shoulder and was asked for her height, then told that they were unable to complete the MRI. She says that anytime she has tried to call she only gets an answering machine.

## 2023-06-02 NOTE — Progress Notes (Signed)
Jenna Raymond - 47 y.o. female MRN [id number]  Date of birth: 22-Dec-1975  Office Visit Note: Visit Date: 06/02/2023 PCP: Jenna Ivan, MD Referred by: Jenna Ivan, MD  Subjective: Chief Complaint  Patient presents with   Left Hip - Pain   HPI: Jenna Raymond is a pleasant 47 y.o. female who presents today for follow-up of left lateral hip pain and right shoulder pain.  The use of an in person Spanish interpreter was used throughout the entirety of the visit today.  Right shoulder -this has been chronic for 9 months.  Did have a previous subacromial joint injection which helped but only for about 2-3 days.  Was seen previously at [facility] clinic.  We did order an MRI of the right shoulder at last visit, but she had difficulty setting this up.  Left lateral hip -  her hip pain is still bothering her.  Reports somewhat of a burning sensation right over the lateral hip, she points to the left greater trochanter.  She unfortunate was unable to set up physical therapy, although would like to do so.   Pertinent ROS were reviewed with the patient and found to be negative unless otherwise specified above in HPI.   Assessment & Plan: Visit Diagnoses:  1. Greater trochanteric pain syndrome of left lower extremity   2. Chronic right shoulder pain   3. Arthritis of right acromioclavicular joint    Plan: Discussed with Jenna Raymond that her left hip pain is indicative of greater trochanteric pain syndrome, she does have some weakness with resisted hip abduction as well.  Given her pain, we did proceed with ultrasound-guided greater trochanteric injection, patient tolerated well.  She needs to get started in formalized physical therapy to work on strengthening and stabilization of the hip, we did have an interpreter help her set up her appointment [facility] after her appointment today.  She also has chronic right shoulder pain that would benefit from  physical therapy evaluation and and treatment, this was placed as above.  Discussed I am not sure the exact etiology of her shoulder pain as she has a lot of guarding and pain with all planes of motion.  She does not have significant arthritis about the shoulder, there is some in the [facility] joint.  I do not believe this is frozen shoulder as she has full passive range of motion.  I will send a message to our scheduler to help her obtain an MRI of the shoulder and we will follow-up in 1 week to review next steps.  As a reminder, she only received about 2 days of improvement from a prior subacromial joint injection at [facility] clinic.  Follow-up: Return for f/u 1 week after MRI of shoulder .   Meds & Orders: No orders of the defined types were placed in this encounter.   Orders Placed This Encounter  Procedures   Large Joint Inj   US Guided Needle Placement - No Linked Charges   Ambulatory referral to Physical Therapy     Procedures: Large Joint Inj: L greater trochanter on 06/02/2023 4:33 PM Indications: pain Details: 22 G 3.5 in needle, ultrasound-guided lateral approach Medications: 2 mL lidocaine 1 %; 2 mL bupivacaine 0.25 %; 6 mg betamethasone acetate-betamethasone sodium phosphate 6 (3-3) MG/ML Outcome: tolerated well, no immediate complications  US-Guided Greater Trochanteric Bursa Injection, Left After discussion on risks/benefits/indications and informed verbal consent was obtained, a timeout was performed. The patient was lying in  lateral recumbent position on exam table. Using ultrasound guidance, the greater trochanter was identified. The area overlying the trochanteric bursa was then prepped with Betadine and alcohol swabs. Following sterile precautions, ultrasound was reapplied to visualize needle guidance with a 22-gauge 3.5" needle utilizing an in-plane approach to inject the bursa with 2:2:1 lidocaine:bupivicaine:betamethasone. Delivery of the injectate was visualized into the region  of hypoechoic fluid of the greater trochanteric bursa. Patient tolerated procedure well without immediate complications.    Procedure, treatment alternatives, risks and benefits explained, specific risks discussed. Consent was given by the patient. Immediately prior to procedure a time out was called to verify the correct patient, procedure, equipment, support staff and site/side marked as required. Patient was prepped and draped in the usual sterile fashion.          Clinical History: No specialty comments available.  She reports that she has never smoked. She has never used smokeless tobacco. No results for input(s): "HGBA1C", "LABURIC" in the last 8760 hours.  Objective:   Vital Signs: Ht 5\' 5"  (1.651 m)   Wt 172 lb (78 kg)   BMI 28.62 kg/m   Physical Exam  Gen: Well-appearing, in no acute distress; non-toxic CV: Well-perfused. Warm.  Resp: Breathing unlabored on room air; no wheezing. Psych: Fluid speech in conversation; appropriate affect; normal thought process Neuro: Sensation intact throughout. No gross coordination deficits.   Ortho Exam - Right shoulder: There is global tenderness to palpation throughout the shoulder joint around the [facility] joint, anterior and lateral aspects near Codman's point.  There is guarding with all planes of range of motion.  I am able to take her passively to full range flexion, abduction and external rotation.  It is difficult to appreciate true weakness but she certainly has weakness with rotator cuff testing secondary to pain.  - Left hip: + TTP over the greater trochanteric and the posterior lateral aspect.  There is 4/5 strength with resisted hip abduction compared to 5/5 strength of the contralateral hip.  No mechanical blocks to internal or external logroll.  No overlying redness or soft tissue swelling.  Imaging: No results found.  Past Medical/Family/Surgical/Social History: Medications & Allergies reviewed per EMR, new medications  updated. There are no problems to display for this patient.  Past Medical History:  Diagnosis Date   Hypertension    Vertigo    Family History  Problem Relation Age of Onset   Breast cancer Paternal Aunt    Past Surgical History:  Procedure Laterality Date   CESAREAN SECTION     COLONOSCOPY WITH PROPOFOL N/A 05/10/2021   Procedure: COLONOSCOPY WITH PROPOFOL;  Surgeon: Jaynie Collins, DO;  Location[facility] ENDOSCOPY;  Service: Gastroenterology;  Laterality: N/A;  SPANISH INTERPRETER   Social History   Occupational History   Not on file  Tobacco Use   Smoking status: Never   Smokeless tobacco: Never  Vaping Use   Vaping status: Never Used  Substance and Sexual Activity   Alcohol use: Never   Drug use: Never   Sexual activity: Not on file

## 2023-06-09 ENCOUNTER — Encounter: Payer: Self-pay | Admitting: Rehabilitative and Restorative Service Providers"

## 2023-06-09 ENCOUNTER — Ambulatory Visit (INDEPENDENT_AMBULATORY_CARE_PROVIDER_SITE_OTHER): Payer: BC Managed Care – PPO | Admitting: Rehabilitative and Restorative Service Providers"

## 2023-06-09 ENCOUNTER — Other Ambulatory Visit: Payer: Self-pay

## 2023-06-09 DIAGNOSIS — M79605 Pain in left leg: Secondary | ICD-10-CM

## 2023-06-09 DIAGNOSIS — M6281 Muscle weakness (generalized): Secondary | ICD-10-CM | POA: Diagnosis not present

## 2023-06-09 DIAGNOSIS — M25511 Pain in right shoulder: Secondary | ICD-10-CM | POA: Diagnosis not present

## 2023-06-09 DIAGNOSIS — G8929 Other chronic pain: Secondary | ICD-10-CM

## 2023-06-09 DIAGNOSIS — R293 Abnormal posture: Secondary | ICD-10-CM | POA: Diagnosis not present

## 2023-06-19 ENCOUNTER — Encounter: Payer: BC Managed Care – PPO | Admitting: Physical Therapy

## 2023-06-26 ENCOUNTER — Encounter: Payer: BC Managed Care – PPO | Admitting: Physical Therapy

## 2023-07-03 ENCOUNTER — Encounter: Payer: BC Managed Care – PPO | Admitting: Rehabilitative and Restorative Service Providers"

## 2023-07-10 ENCOUNTER — Encounter: Payer: BC Managed Care – PPO | Admitting: Rehabilitative and Restorative Service Providers"

## 2023-07-17 ENCOUNTER — Encounter: Payer: BC Managed Care – PPO | Admitting: Rehabilitative and Restorative Service Providers"

## 2024-01-20 IMAGING — MG MM DIGITAL DIAGNOSTIC UNILAT*L* W/ TOMO W/ CAD
8 of 9 series · 8 of 17 positions shown · non-contrast
Comparison: Previous exams 05/06/2021 and baseline mammography
04/16/2021.

CLINICAL DATA: Short-term interval follow-up of likely benign
calcifications involving the LEFT breast identified on baseline
mammography.

EXAM:
DIGITAL DIAGNOSTIC UNILATERAL LEFT MAMMOGRAM WITH TOMOSYNTHESIS AND
CAD
TECHNIQUE: Left digital diagnostic mammography and breast tomosynthesis was
performed. The images were evaluated with computer-aided detection.

[L ML (1 of 2)]
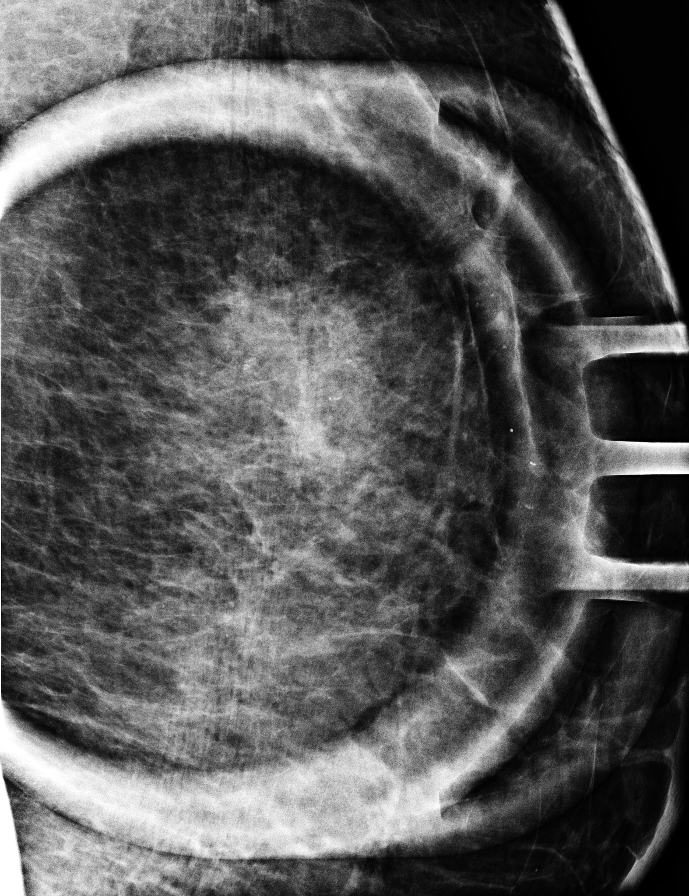

[L LM]
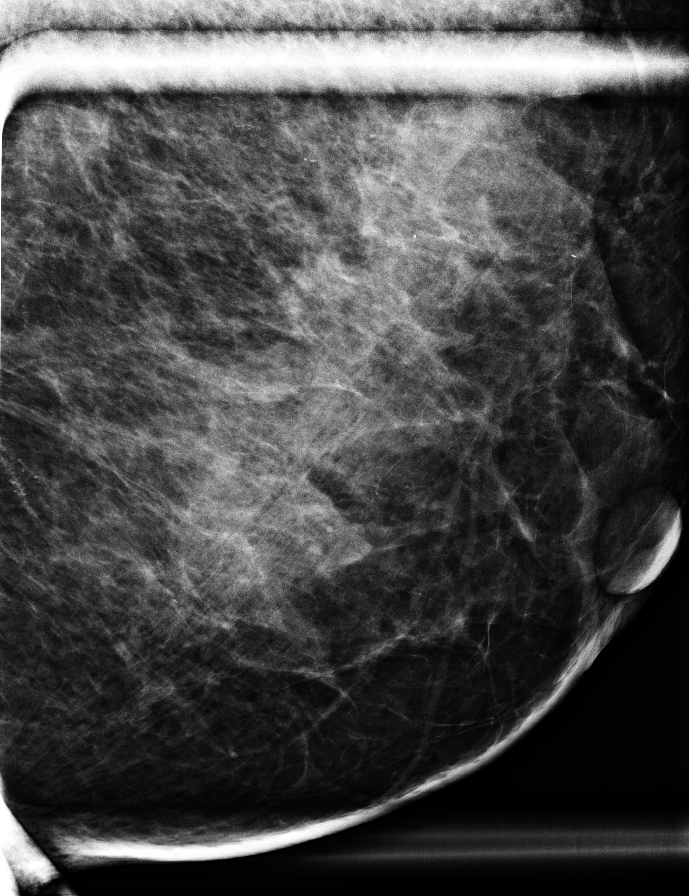

[L ML (2 of 2)]
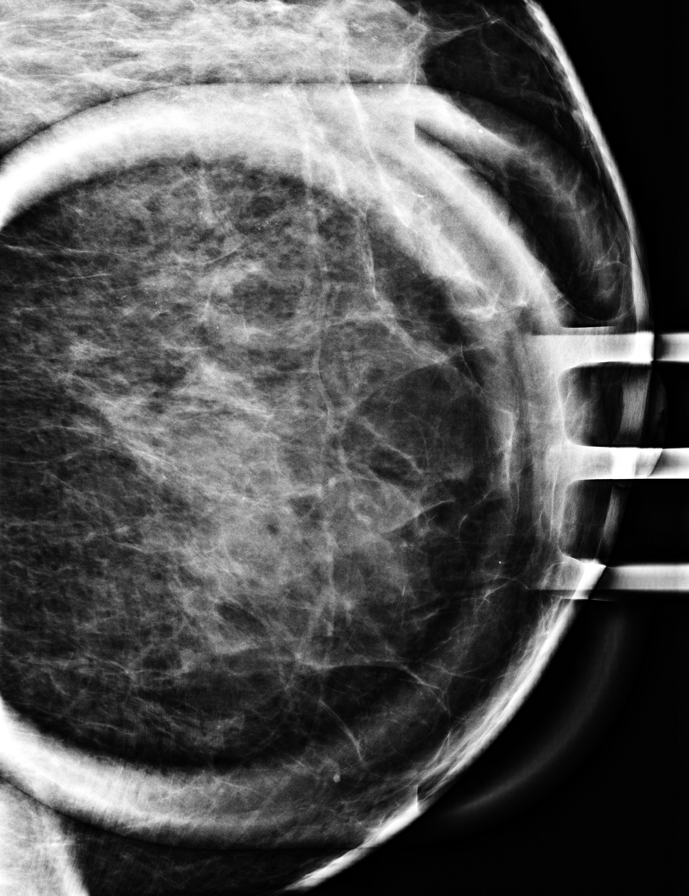

[L XCCL]
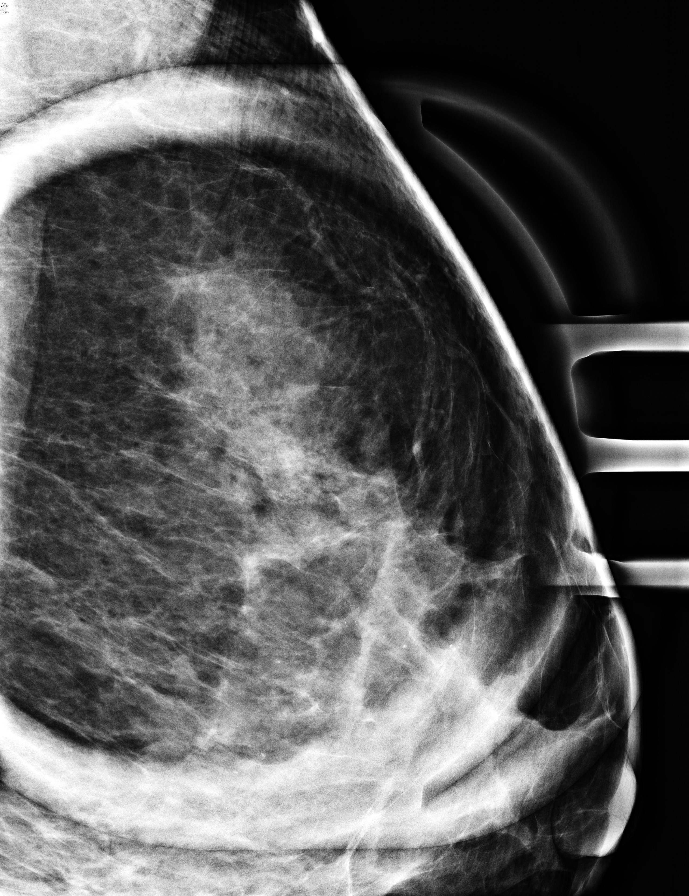

[L CC]
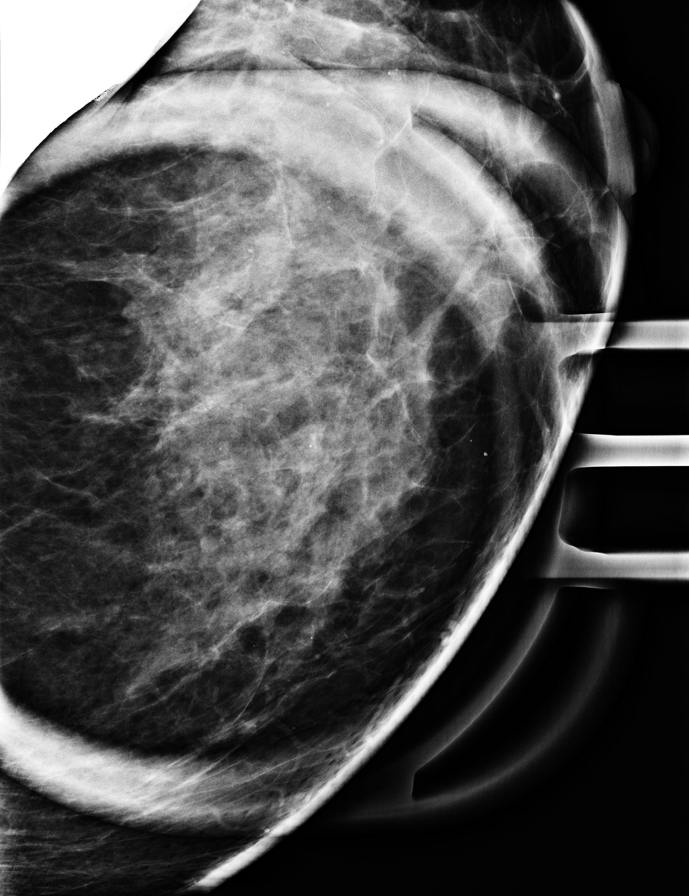

[L MLO synth-2D]
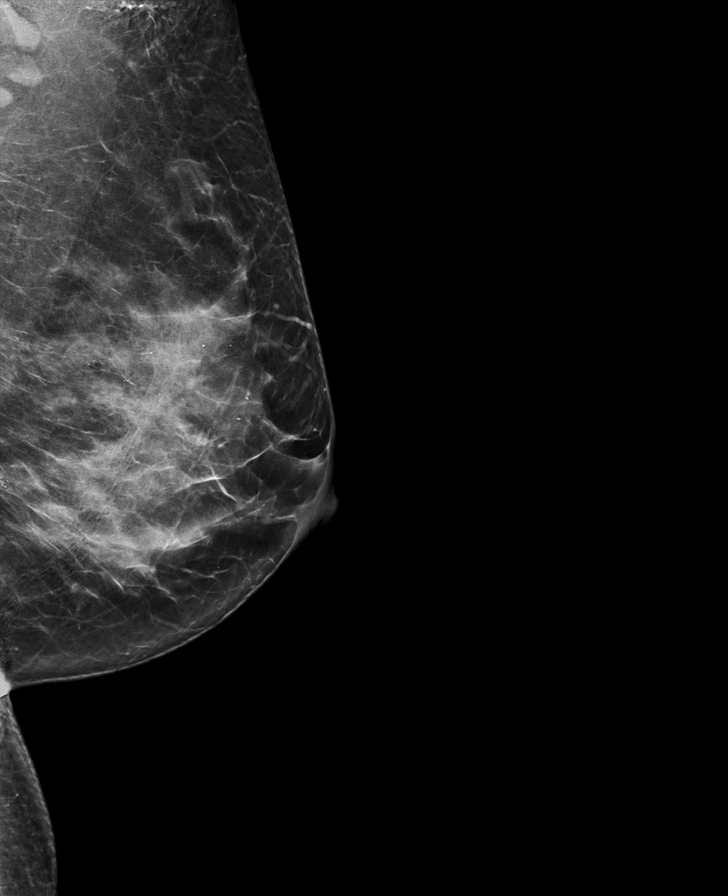

[L CC synth-2D]
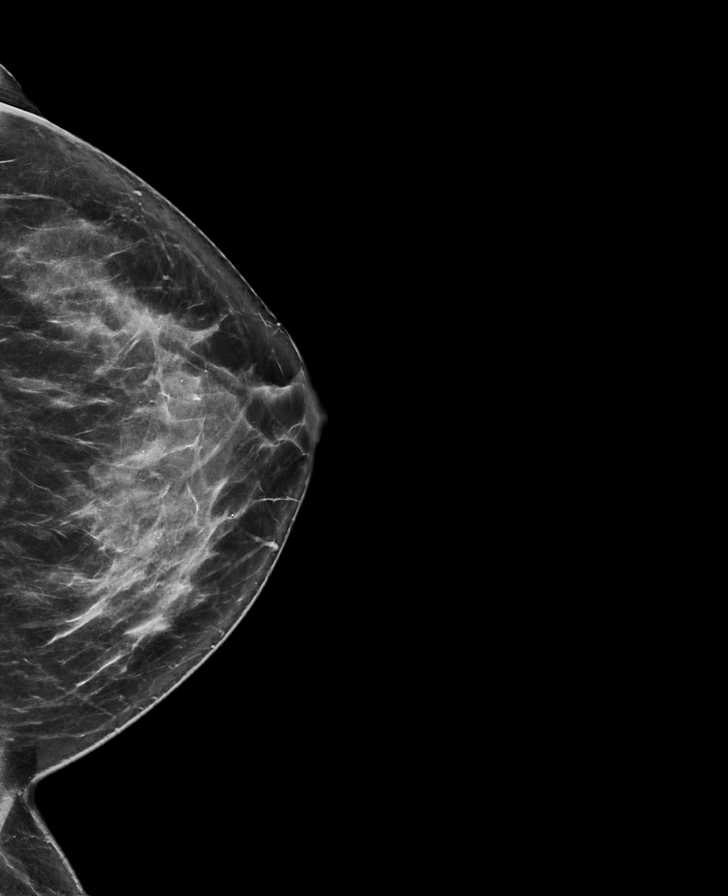

[L MLO tomo · tomo slice 39/77.0]
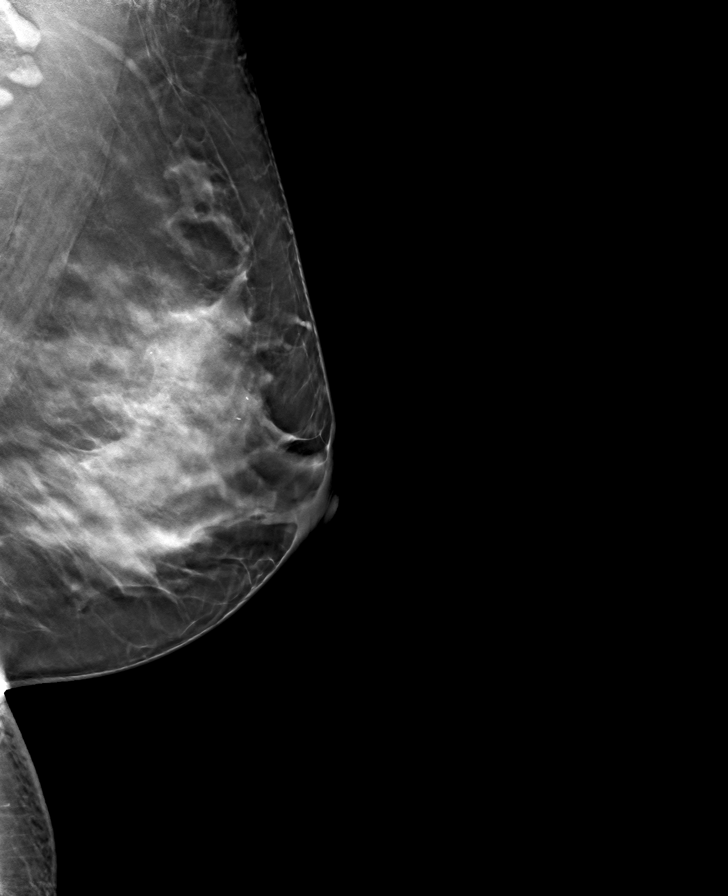

[8 of 17 positions shown; findings below may reference images not displayed]

ACR Breast Density Category c: The breast tissue is heterogeneously
dense, which may obscure small masses.
FINDINGS: Full field CC and MLO views and spot magnification CC and
mediolateral views of the calcifications were obtained.

There are calcifications scattered throughout the breast, though
there are 3 separate groups that are most conspicuous on the
mediolateral view. A group in the central breast at middle to
posterior depth spans approximately 0.4 cm; the calcifications are
punctate in morphology on the CC view and some demonstrate layering
on the mediolateral view. A group in the inner breast at middle to
posterior depth spans approximately 0.8 cm; the calcifications are
punctate in morphology on the CC view and some demonstrate layering
on the mediolateral view. The largest group is in the lower breast
at posterior depth and is only conspicuous on the mediolateral view,
spanning approximately blank; these calcifications are punctate and
amorphous in morphology and some demonstrate layering on the
mediolateral view.

These calcifications are unchanged since the diagnostic mammogram 6
months ago. There are no new suspicious linear or branching forms
involving any of these groups.
IMPRESSION: Stable likely benign calcifications involving the LEFT breast.

RECOMMENDATION:
BILATERAL diagnostic mammography in 6 months (to include spot
magnification views of the LEFT breast calcifications).

I have discussed the findings and recommendations with the patient.
Communication with the patient was achieved with the assistance of a
certified interpreter. If applicable, a reminder letter will be sent
to the patient regarding the next appointment.

BI-RADS CATEGORY  3: Probably benign.

## 2024-03-23 ENCOUNTER — Other Ambulatory Visit: Payer: Self-pay | Admitting: Nurse Practitioner

## 2024-03-23 DIAGNOSIS — Z1231 Encounter for screening mammogram for malignant neoplasm of breast: Secondary | ICD-10-CM

## 2024-04-13 ENCOUNTER — Ambulatory Visit: Admitting: Urology

## 2024-04-13 VITALS — BP 142/86 | HR 76 | Ht 60.0 in | Wt 182.0 lb

## 2024-04-13 DIAGNOSIS — R300 Dysuria: Secondary | ICD-10-CM | POA: Diagnosis not present

## 2024-04-13 DIAGNOSIS — R3129 Other microscopic hematuria: Secondary | ICD-10-CM | POA: Diagnosis not present

## 2024-04-13 LAB — URINALYSIS, COMPLETE
Bilirubin, UA: NEGATIVE
Glucose, UA: NEGATIVE
Ketones, UA: NEGATIVE
Leukocytes,UA: NEGATIVE
Nitrite, UA: NEGATIVE
Protein,UA: NEGATIVE
Specific Gravity, UA: 1.03 (ref 1.005–1.030)
Urobilinogen, Ur: 0.2 mg/dL (ref 0.2–1.0)
pH, UA: 6 (ref 5.0–7.5)

## 2024-04-13 LAB — MICROSCOPIC EXAMINATION: Bacteria, UA: NONE SEEN

## 2024-04-13 NOTE — Patient Instructions (Signed)
Scheduling [phone] 

## 2024-04-13 NOTE — Progress Notes (Signed)
 I, Jenna Raymond, acting as a Neurosurgeon for Jenna JAYSON Barba, MD., have documented all relevant documentation on the behalf of Jenna JAYSON Barba, MD, as directed by Jenna JAYSON Barba, MD while in the presence of Jenna JAYSON Barba, MD.  04/13/2024 4:27 PM   Malonie Maryanne Cuchillas de Raymond 07/25/1976 [id number]  Referring provider: Debarah Catheryn PARAS, FNP [address] [place],  [place] [address]  Chief Complaint  Patient presents with   Dysuria    HPI: Jenna Raymond is a 48 y.o. female referred for evaluation of dysuria. A [place] Spanish interpreter was present during this visit.  In February 2025 she was having vaginal itching and burning, and took D-mannose/Cranberry tablets, and Monistat with improvement in her symptoms.A few weeks later, she developed frequency-urgency dysuria and was treated for a UTI.  She had a urinalysis with her PCP, which showed positive blood on dipstick, however a microscopic examination was not ordered.  She presently has mild dysuria and occasional left flank pain.  No previous history of urologic problems, or Cephradine. No prior urologic evaluation  Denies fever, chills, or gross hematuria.  No previous tobacco history.   PMH: Past Medical History:  Diagnosis Date   Hypertension    Vertigo     Surgical History: Past Surgical History:  Procedure Laterality Date   CESAREAN SECTION     COLONOSCOPY WITH PROPOFOL  N/A 05/10/2021   Procedure: COLONOSCOPY WITH PROPOFOL ;  Surgeon: Onita Elspeth Sharper, DO;  Location: [facility] ENDOSCOPY;  Service: Gastroenterology;  Laterality: N/A;  SPANISH INTERPRETER    Home Medications:  Allergies as of 04/13/2024   No Known Allergies      Medication List        Accurate as of April 13, 2024  4:27 PM. If you have any questions, ask your nurse or doctor.          albuterol  108 (90 Base) MCG/ACT inhaler Commonly known as: VENTOLIN  HFA Inhale 2 puffs into the lungs every 6 (six) hours as needed  for wheezing or shortness of breath. Suspected COVID   celecoxib 200 MG capsule Commonly known as: CELEBREX Take 200 mg by mouth.   fluticasone  50 MCG/ACT nasal spray Commonly known as: FLONASE  Place 1 spray into both nostrils 2 (two) times daily.   hydrochlorothiazide 25 MG tablet Commonly known as: HYDRODIURIL Take 1 tablet by mouth daily.   losartan 25 MG tablet Commonly known as: COZAAR Take 25 mg by mouth at bedtime.   meclizine  25 MG tablet Commonly known as: ANTIVERT  Take 1 tablet (25 mg total) by mouth 3 (three) times daily as needed for dizziness.   meclizine  25 MG tablet Commonly known as: ANTIVERT  Take 1 tablet (25 mg total) by mouth 3 (three) times daily as needed for dizziness.   ondansetron  4 MG tablet Commonly known as: Zofran  Take 1 tablet (4 mg total) by mouth every 8 (eight) hours as needed.   Vitamin D3 10 MCG (400 UNIT) tablet Take 800 Units by mouth.        Allergies: No Known Allergies  Family History: Family History  Problem Relation Age of Onset   Breast cancer Paternal Aunt     Social History:  reports that she has never smoked. She has never used smokeless tobacco. She reports that she does not drink alcohol and does not use drugs.   Physical Exam: BP (!) 142/86   Pulse 76   Ht 5' (1.524 m)   Wt 182 lb (82.6  kg)   BMI 35.54 kg/m   Constitutional:  Alert and oriented, No acute distress. HEENT: [place] AT Respiratory: Normal respiratory effort, no increased work of breathing. Psychiatric: Normal mood and affect.   Urinalysis Dipstick trace blood/microscopy 3-10 RBC   Assessment & Plan:    1. Microhematuria AUA hematuria risk stratification: Intermediate Associated with dysuria and intermittent left flank pain. Recommend CT renal stone study in lieu of a renal ultrasound.  A cystoscopy was tentatively scheduled if she does not have a ureteral calculus on CT.  All questions were answered, and she desires to proceed with further  evaluation.  I have reviewed the above documentation for accuracy and completeness, and I agree with the above.   Jenna JAYSON Barba, MD  [facility] [address], [address] [place], [place] [address] [phone]

## 2024-04-18 ENCOUNTER — Encounter: Payer: Self-pay | Admitting: Urology

## 2024-05-17 ENCOUNTER — Ambulatory Visit
Admission: RE | Admit: 2024-05-17 | Discharge: 2024-05-17 | Disposition: A | Discharge status: Home / Self Care | Source: Ambulatory Visit | Attending: Urology | Admitting: Urology

## 2024-05-17 DIAGNOSIS — R300 Dysuria: Secondary | ICD-10-CM | POA: Insufficient documentation

## 2024-05-17 DIAGNOSIS — R3129 Other microscopic hematuria: Secondary | ICD-10-CM | POA: Diagnosis present

## 2024-05-31 ENCOUNTER — Ambulatory Visit (INDEPENDENT_AMBULATORY_CARE_PROVIDER_SITE_OTHER): Admitting: Urology

## 2024-05-31 ENCOUNTER — Other Ambulatory Visit: Admitting: Urology

## 2024-05-31 ENCOUNTER — Encounter: Payer: Self-pay | Admitting: Urology

## 2024-05-31 VITALS — BP 115/73 | HR 83 | Ht 62.0 in | Wt 182.0 lb

## 2024-05-31 DIAGNOSIS — R3989 Other symptoms and signs involving the genitourinary system: Secondary | ICD-10-CM

## 2024-05-31 DIAGNOSIS — R3129 Other microscopic hematuria: Secondary | ICD-10-CM

## 2024-05-31 LAB — URINALYSIS, COMPLETE
Bilirubin, UA: NEGATIVE
Glucose, UA: NEGATIVE
Ketones, UA: NEGATIVE
Leukocytes,UA: NEGATIVE
Nitrite, UA: NEGATIVE
Protein,UA: NEGATIVE
Specific Gravity, UA: 1.02 (ref 1.005–1.030)
Urobilinogen, Ur: 0.2 mg/dL (ref 0.2–1.0)
pH, UA: 6 (ref 5.0–7.5)

## 2024-05-31 LAB — MICROSCOPIC EXAMINATION: Epithelial Cells (non renal): >10 /HPF — AB (ref 0–10)

## 2024-05-31 MED ORDER — HYOSCYAMINE SULFATE 0.125 MG SL SUBL: 0.1250 mg | SUBLINGUAL_TABLET | Freq: Four times a day (QID) | SUBLINGUAL | 0 refills | Status: AC | PRN

## 2024-05-31 NOTE — Progress Notes (Signed)
   05/31/24  CC:  Chief Complaint  Patient presents with   Cysto    HPI: Refer to my prior note 04/13/2024.  A [place] Spanish interpreter was present during this visit.  CT renal stone study showed no urinary tract calculi or hydronephrosis.  UA today 3-10 RBC/>10 epis  Blood pressure 115/73, pulse 83, height 5' 2 (1.575 m), weight 182 lb (82.6 kg). NED. A&Ox3.   No respiratory distress   Abd soft, NT, ND Normal external genitalia with patent urethral meatus  Cystoscopy Procedure Note  Patient identification was confirmed, informed consent was obtained, and patient was prepped using Betadine solution.  Lidocaine  jelly was administered per urethral meatus.    Procedure: - Flexible cystoscope introduced, without any difficulty.   - Thorough search of the bladder revealed:    normal urethral meatus    normal urothelium    no stones    no ulcers     no tumors    no urethral polyps    no trabeculation  - Ureteral orifices were normal in position and appearance.  Post-Procedure: - Patient tolerated the procedure well  Assessment/ Plan: No significant abnormalities on cystoscopy No significant abnormalities on CT renal stone study Primary complaint is urethral pain/spasm not always related to voiding and sometimes at the end of voiding Trial hyoscyamine  0.125 mg SL every 6 hours prn   Glendia JAYSON Barba, MD

## 2024-07-27 ENCOUNTER — Encounter: Payer: Self-pay | Admitting: Rheumatology

## 2024-07-27 ENCOUNTER — Other Ambulatory Visit: Payer: Self-pay | Admitting: Rheumatology

## 2024-07-27 DIAGNOSIS — R202 Paresthesia of skin: Secondary | ICD-10-CM

## 2024-07-27 DIAGNOSIS — G8929 Other chronic pain: Secondary | ICD-10-CM

## 2024-08-05 ENCOUNTER — Ambulatory Visit
Admission: RE | Admit: 2024-08-05 | Discharge: 2024-08-05 | Disposition: A | Discharge status: Home / Self Care | Source: Ambulatory Visit | Attending: Rheumatology | Admitting: Rheumatology

## 2024-08-05 DIAGNOSIS — G8929 Other chronic pain: Secondary | ICD-10-CM

## 2024-08-05 DIAGNOSIS — R202 Paresthesia of skin: Secondary | ICD-10-CM
